# Patient Record
Sex: Male | Born: 1950 | Race: Black or African American | Hispanic: No | Marital: Single | Smoking: Former smoker
Health system: Southern US, Community
[De-identification: ages and names within clinical notes are randomized; demographics above are authoritative.]

## PROBLEM LIST (undated history)

## (undated) DIAGNOSIS — E7800 Pure hypercholesterolemia, unspecified: Secondary | ICD-10-CM

## (undated) DIAGNOSIS — I739 Peripheral vascular disease, unspecified: Secondary | ICD-10-CM

## (undated) DIAGNOSIS — F1910 Other psychoactive substance abuse, uncomplicated: Secondary | ICD-10-CM

## (undated) DIAGNOSIS — F32A Depression, unspecified: Secondary | ICD-10-CM

## (undated) DIAGNOSIS — Z720 Tobacco use: Secondary | ICD-10-CM

## (undated) DIAGNOSIS — M1990 Unspecified osteoarthritis, unspecified site: Secondary | ICD-10-CM

## (undated) HISTORY — PX: ELBOW SURGERY: SHX618

## (undated) HISTORY — DX: Peripheral vascular disease, unspecified: I73.9

## (undated) HISTORY — PX: TRACHEOSTOMY: SUR1362

## (undated) HISTORY — DX: Tobacco use: Z72.0

## (undated) HISTORY — PX: SHOULDER SURGERY: SHX246

---

## 2017-02-04 ENCOUNTER — Encounter (INDEPENDENT_AMBULATORY_CARE_PROVIDER_SITE_OTHER): Payer: Self-pay | Admitting: Orthopaedic Surgery

## 2017-02-04 ENCOUNTER — Ambulatory Visit (INDEPENDENT_AMBULATORY_CARE_PROVIDER_SITE_OTHER): Payer: Non-veteran care | Admitting: Orthopaedic Surgery

## 2017-02-04 DIAGNOSIS — S83241A Other tear of medial meniscus, current injury, right knee, initial encounter: Secondary | ICD-10-CM

## 2017-02-04 NOTE — Progress Notes (Signed)
   Office Visit Note   Patient: Adrian Washington           Date of Birth: 04-02-1950           MRN: [id number] Visit Date: 02/04/2017              Requested by: Adrian Washington, Jonathan R, PA-C [id number] [place], [place] [id number] PCP: Patient, No Pcp Per   Assessment & Plan: Visit Diagnoses:  1. 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  MRI and x-rays were reviewed and findings were discussed with the patient.  We discussed diagnostic arthroscopy and debridement as indicated however risks of surgery include incomplete relief of pain and infection.  I suspect that a component of his pain is arthritic his rather than meniscal.  Patient understands risks and would like to proceed with arthroscopic surgery.  Patient encouraged and answered. Total face to face encounter time was greater than 45 minutes and over half of this time was spent in counseling and/or coordination of care.  Follow-Up Instructions: Return if symptoms worsen or fail to improve.   Orders:  No orders of the defined types were placed in this encounter.  No orders of the defined types were placed in this encounter.     Procedures: No procedures performed   Clinical Data: No additional findings.   Subjective: Chief Complaint  Patient presents with  . Right Knee - Pain    Patient is a 66 year old gentleman who is here for [facility].  He describes medial bilateral right knee pain with minimal mechanical symptoms.  Denies any swelling.  He endorses chronic constant pain.  Cortisone injections have provided little relief.  He is also had HA injections with minimal relief.      Review of Systems  Constitutional: Negative.   All other systems reviewed and are negative.    Objective: Vital Signs: There were no vitals taken for this  visit.  Physical Exam  Constitutional: He is oriented to person, place, and time. He appears well-developed and well-nourished.  HENT:  Head: Normocephalic and atraumatic.  Eyes: Pupils are equal, round, and reactive to light.  Neck: Neck supple.  Pulmonary/Chest: Effort normal.  Abdominal: Soft.  Musculoskeletal: Normal range of motion.  Neurological: He is alert and oriented to person, place, and time.  Skin: Skin is warm.  Psychiatric: He has a normal mood and affect. His behavior is normal. Judgment and thought content normal.  Nursing note and vitals reviewed.   Ortho Exam Right knee exam shows no joint effusion.  Collaterals and cruciates are stable.  No patella tracking. Specialty Comments:  No specialty comments available.  Imaging: No results found.   PMFS History: Patient Active Problem List   Diagnosis Date Noted  . Acute medial meniscus tear of right knee 02/04/2017   History reviewed. No pertinent past medical history.  History reviewed. No pertinent family history.  History reviewed. No pertinent surgical history. Social History   Occupational History  . Not on file  Tobacco Use  . Smoking status: Never Smoker  . Smokeless tobacco: Never Used  Substance and Sexual Activity  . Alcohol use: Not on file  . Drug use: Not on file  . Sexual activity: Not on file

## 2017-02-07 ENCOUNTER — Telehealth (INDEPENDENT_AMBULATORY_CARE_PROVIDER_SITE_OTHER): Payer: Self-pay | Admitting: Orthopaedic Surgery

## 2017-02-07 NOTE — Telephone Encounter (Signed)
02[facility]04/2017 [facility] [place] [phone]/referring office

## 2017-02-13 ENCOUNTER — Other Ambulatory Visit: Payer: Self-pay

## 2017-02-13 ENCOUNTER — Encounter (HOSPITAL_COMMUNITY): Payer: Self-pay

## 2017-02-13 ENCOUNTER — Emergency Department (HOSPITAL_COMMUNITY)
Admission: EM | Admit: 2017-02-13 | Discharge: 2017-02-13 | Disposition: A | Discharge status: Home / Self Care | Payer: Non-veteran care | Attending: Emergency Medicine | Admitting: Emergency Medicine

## 2017-02-13 DIAGNOSIS — F17210 Nicotine dependence, cigarettes, uncomplicated: Secondary | ICD-10-CM | POA: Insufficient documentation

## 2017-02-13 DIAGNOSIS — R42 Dizziness and giddiness: Secondary | ICD-10-CM | POA: Diagnosis present

## 2017-02-13 DIAGNOSIS — R112 Nausea with vomiting, unspecified: Secondary | ICD-10-CM | POA: Insufficient documentation

## 2017-02-13 DIAGNOSIS — R197 Diarrhea, unspecified: Secondary | ICD-10-CM | POA: Diagnosis not present

## 2017-02-13 HISTORY — DX: Pure hypercholesterolemia, unspecified: E78.00

## 2017-02-13 LAB — COMPREHENSIVE METABOLIC PANEL
ALBUMIN: 3.7 g/dL (ref 3.5–5.0)
ALK PHOS: 109 U/L (ref 38–126)
ALT: 18 U/L (ref 17–63)
AST: 24 U/L (ref 15–41)
Anion gap: 10 (ref 5–15)
BUN: 8 mg/dL (ref 6–20)
CHLORIDE: 102 mmol/L (ref 101–111)
CO2: 29 mmol/L (ref 22–32)
CREATININE: 0.77 mg/dL (ref 0.61–1.24)
Calcium: 9.3 mg/dL (ref 8.9–10.3)
GFR calc Af Amer: >60 mL/min (ref >60)
GFR calc non Af Amer: >60 mL/min (ref >60)
GLUCOSE: 110 mg/dL — AB (ref 65–99)
Potassium: 3.7 mmol/L (ref 3.5–5.1)
SODIUM: 141 mmol/L (ref 135–145)
Total Bilirubin: 0.6 mg/dL (ref 0.3–1.2)
Total Protein: 7.6 g/dL (ref 6.5–8.1)

## 2017-02-13 LAB — URINALYSIS, ROUTINE W REFLEX MICROSCOPIC
Bilirubin Urine: NEGATIVE
GLUCOSE, UA: NEGATIVE mg/dL
Hgb urine dipstick: NEGATIVE
Ketones, ur: NEGATIVE mg/dL
LEUKOCYTES UA: NEGATIVE
NITRITE: NEGATIVE
PH: 6 (ref 5.0–8.0)
Protein, ur: NEGATIVE mg/dL
SPECIFIC GRAVITY, URINE: 1.019 (ref 1.005–1.030)

## 2017-02-13 LAB — CBG MONITORING, ED: Glucose-Capillary: 99 mg/dL (ref 65–99)

## 2017-02-13 LAB — CBC
HEMATOCRIT: 40.1 % (ref 39.0–52.0)
HEMOGLOBIN: 13.6 g/dL (ref 13.0–17.0)
MCH: 31.8 pg (ref 26.0–34.0)
MCHC: 33.9 g/dL (ref 30.0–36.0)
MCV: 93.7 fL (ref 78.0–100.0)
Platelets: 233 10*3/uL (ref 150–400)
RBC: 4.28 MIL/uL (ref 4.22–5.81)
RDW: 15.3 % (ref 11.5–15.5)
WBC: 8.2 10*3/uL (ref 4.0–10.5)

## 2017-02-13 LAB — LIPASE, BLOOD: LIPASE: 26 U/L (ref 11–51)

## 2017-02-13 MED ORDER — SODIUM CHLORIDE 0.9 % IV BOLUS (SEPSIS)
1000.0000 mL | Freq: Once | INTRAVENOUS | Status: AC
  Administered 2017-02-13: 1000 mL via INTRAVENOUS

## 2017-02-13 NOTE — ED Provider Notes (Signed)
[facility] EMERGENCY DEPARTMENT Provider Note   CSN: [id number] Arrival date & time: 02/13/17  0744     History   Chief Complaint Chief Complaint  Patient presents with  . Emesis  . Dizziness    HPI Elvyn Krohn is a 66 y.o. male with a history of hypercholesterolemia who presents to the emergency department complaining of dizziness intermittently since yesterday.  Patient describes his dizziness as feeling off balance- no room spinning or feeling lightheaded as if he may pass out.  States this only occurs when he transitions from sitting to standing and walks a few steps, the dizziness has not occurred at any other time.  Has some mild blurring of his vision with the dizziness, otherwise no associated symptoms.  Patient does note some nausea, 2 episodes of vomiting, and 2 episodes of diarrhea in the past 24 hours, emesis and diarrhea are non-bloody.  Patient states that the symptoms have not been occurring with the dizziness, they seem to be occurring completely separately, starting after dizziness episodes started. Has noted some difficulty sleeping and took a double dose of his trazodone night before onset of sxs.  Patient denies abdominal pain, fever, chills, chest pain, difficulty breathing,lightheadedness, or syncope. No numbness or weakness.   HPI  Past Medical History:  Diagnosis Date  . Hypercholesteremia     Patient Active Problem List   Diagnosis Date Noted  . Acute medial meniscus tear of right knee 02/04/2017    History reviewed. No pertinent surgical history.     Home Medications    Prior to Admission medications   Medication Sig Start Date End Date Taking? Authorizing Provider  Cholecalciferol (VITAMIN D) 2000 units tablet Take 2,000 Units by mouth daily.   Yes [provider]  simvastatin (ZOCOR) 80 MG tablet Take 80 mg by mouth at bedtime.   Yes [provider]  traZODone (DESYREL) 100 MG tablet Take 100 mg by mouth at  bedtime.   Yes [provider]    Family History History reviewed. No pertinent family history.  Social History Social History   Tobacco Use  . Smoking status: Current Every Day Smoker    Packs/day: 2.00    Types: Cigarettes  . Smokeless tobacco: Never Used  Substance Use Topics  . Alcohol use: No    Frequency: Never  . Drug use: Not on file     Allergies   Patient has no known allergies.   Review of Systems Review of Systems  Constitutional: Positive for appetite change (decreased ). Negative for chills and fever.  HENT: Negative for congestion, ear discharge, ear pain, hearing loss and tinnitus.   Eyes: Positive for visual disturbance (mild blurry vision with dizziness).  Respiratory: Negative for shortness of breath.   Cardiovascular: Negative for chest pain.  Gastrointestinal: Positive for diarrhea, nausea and vomiting. Negative for abdominal pain and blood in stool.  Genitourinary: Negative for dysuria.  Neurological: Positive for dizziness. Negative for syncope, weakness, light-headedness, numbness and headaches.    Physical Exam Updated Vital Signs BP (!) 122/93 (BP Location: Right Arm)   Pulse 60   Temp 97.7 F (36.5 C) (Oral)   Resp 17   Ht 5' 5.5" (1.664 m)   Wt 52.2 kg (115 lb)   SpO2 97%   BMI 18.85 kg/m   Physical Exam  Constitutional: He appears well-developed and well-nourished.  Non-toxic appearance. No distress.  HENT:  Head: Normocephalic and atraumatic.  Right Ear: Tympanic membrane normal.  Left Ear: Tympanic  membrane normal.  Nose: Nose normal.  Mouth/Throat: Uvula is midline.  Eyes: Conjunctivae are normal. Pupils are equal, round, and reactive to light. Right eye exhibits no discharge. Left eye exhibits no discharge.  EOMI, rightward nystagmus with EOM, extinction within 3 seconds.   Neck: Normal range of motion. Neck supple. No spinous process tenderness present.  Cardiovascular: Normal rate and regular rhythm.  No murmur  heard. Pulmonary/Chest: Breath sounds normal. No respiratory distress. He has no wheezes. He has no rales.  Abdominal: Soft. He exhibits no distension. There is no tenderness. There is no rigidity, no rebound, no guarding and no CVA tenderness.  Neurological:  Alert. Clear speech. No facial droop. CNIII-XII are intact. Bilateral upper and lower extremities' sensation intact to sharp and dull touch. 5/5 grip strength bilaterally. 5/5 plantar and dorsi flexion bilaterally. Normal finger to nose and heel to shin bilaterally. Negative pronator drift. Patient has balance difficulty when initially standing after transition from sitting. No ataxia.   Skin: Skin is warm and dry. No rash noted.  Psychiatric: He has a normal mood and affect. His behavior is normal.  Nursing note and vitals reviewed.   ED Treatments / Results  Labs Results for orders placed or performed during the hospital encounter of 02/13/17  CBC  Result Value Ref Range   WBC 8.2 4.0 - 10.5 K/uL   RBC 4.28 4.22 - 5.81 MIL/uL   Hemoglobin 13.6 13.0 - 17.0 g/dL   HCT 36.640.1 44.039.0 - 34.752.0 %   MCV 93.7 78.0 - 100.0 fL   MCH 31.8 26.0 - 34.0 pg   MCHC 33.9 30.0 - 36.0 g/dL   RDW 42.515.3 95.611.5 - 38.715.5 %   Platelets 233 150 - 400 K/uL  Urinalysis, Routine w reflex microscopic  Result Value Ref Range   Color, Urine YELLOW YELLOW   APPearance CLEAR CLEAR   Specific Gravity, Urine 1.019 1.005 - 1.030   pH 6.0 5.0 - 8.0   Glucose, UA NEGATIVE NEGATIVE mg/dL   Hgb urine dipstick NEGATIVE NEGATIVE   Bilirubin Urine NEGATIVE NEGATIVE   Ketones, ur NEGATIVE NEGATIVE mg/dL   Protein, ur NEGATIVE NEGATIVE mg/dL   Nitrite NEGATIVE NEGATIVE   Leukocytes, UA NEGATIVE NEGATIVE  Lipase, blood  Result Value Ref Range   Lipase 26 11 - 51 U/L  Comprehensive metabolic panel  Result Value Ref Range   Sodium 141 135 - 145 mmol/L   Potassium 3.7 3.5 - 5.1 mmol/L   Chloride 102 101 - 111 mmol/L   CO2 29 22 - 32 mmol/L   Glucose, Bld 110 (H) 65 - 99  mg/dL   BUN 8 6 - 20 mg/dL   Creatinine, Ser 5.640.77 0.61 - 1.24 mg/dL   Calcium 9.3 8.9 - 33.210.3 mg/dL   Total Protein 7.6 6.5 - 8.1 g/dL   Albumin 3.7 3.5 - 5.0 g/dL   AST 24 15 - 41 U/L   ALT 18 17 - 63 U/L   Alkaline Phosphatase 109 38 - 126 U/L   Total Bilirubin 0.6 0.3 - 1.2 mg/dL   GFR calc non Af Amer >60 >60 mL/min   GFR calc Af Amer >60 >60 mL/min   Anion gap 10 5 - 15  CBG monitoring, ED  Result Value Ref Range   Glucose-Capillary 99 65 - 99 mg/dL   No results found. EKG  EKG Interpretation  Date/Time:  Thursday February 13 2017 07:49:43 EST Ventricular Rate:  68 PR Interval:  204 QRS Duration: 104 QT Interval:  398  QTC Calculation: 423 R Axis:   73 Text Interpretation:  Normal sinus rhythm Left ventricular hypertrophy Abnormal ECG No old tracing to compare Confirmed by Wentz, Elliott [phone]([id number]) on 12/27/20Mancel Bale18 12:29:14 PM      Radiology No results found.  Procedures Procedures (including critical care time)  Medications Ordered in ED Medications  sodium chloride 0.9 % bolus 1,000 mL (not administered)    Initial Impression / Assessment and Plan / ED Course  I have reviewed the triage vital signs and the nursing notes.  Pertinent labs & imaging results that were available during my care of the patient were reviewed by me and considered in my medical decision making (see chart for details).  Patient presents with disequilibrium when transitioning from sitting to standing as well as N//V/D since yesterday. Disequilibrium developed prior to N/V/D. Patient is nontoxic appearing with stable vital signs. Rightward horizontal nystagmus with extinction within 3 seconds, no rotational or vertical nystagmus. His neurologic exam is without focal deficits. Patient is able to ambulate, normal finger to nose. Doubt CVA or other central cause for his symptoms.  Orthostatic vitals without significant change- however patient is symptomatic with transition from sitting to standing.  Patient also with N/V/D, non bloody, patient has no abdominal pain, completely benign abdominal exam.  No indication of appendicitis, bowel obstruction, bowel perforation, cholecystitis, pancreatitis, or diverticulitis.  Will treat with fluids and re-evaluation. Screening labs grossly unremarkable. EKG with LVH, no arrhythmia or significant ST/T wave changes. Patient feeling somewhat better and ready to go home on re-evaluation. Patient is hemodynamically stable, non toxic appearing, and in no apparent distress. Suspect viral etiology to symptoms. Instructed patient to remain well-hydrated and to practice good sleep hygiene. Recommended Kaopectate for diarrhea.  Also discussed elevated blood pressure and changes on EKG, instructed patient that he will need to follow-up with his primary care provider in regards to this as well as for his symptoms that he has been having over the past 24 hours within 1 week.  I discussed results, treatment plan, need for PCP follow-up, and return precautions with the patient. Provided opportunity for questions, patient confirmed understanding and is in agreement with plan.   Findings and plan of care discussed with supervising physician Dr. Effie ShyWentz who personally evaluated and examined this patient.   Final Clinical Impressions(s) / ED Diagnoses   Final diagnoses:  Nausea vomiting and diarrhea  Dizziness    ED Discharge Orders    None       Cherly Andersonetrucelli, Alquan Morrish R, PA-C 02/13/17 1659    Mancel BaleWentz, Elliott, MD 02/13/17 1700

## 2017-02-13 NOTE — ED Triage Notes (Signed)
Pt states dizziness with nausea and emesis that started yesterday. He states he was at work when the episode started. Pt appears pale. Skin warm and dry.

## 2017-02-13 NOTE — Discharge Instructions (Addendum)
You were seen in the emergency department for dizziness, nausea, vomiting, and diarrhea. We suspect these symptoms to be virus related. Your lab work did not show signs of infection, anemia, electrolyte abnormality, or a problem with your liver, pancreas, or kidneys.   Your blood pressure was elevated in the emergency department today and your EKG showed some findings that are possibly consistent with this. It is important that you follow up to have this rechecked and possibly start blood pressure medication.  As discussed it is important that you rest, remain well hydrated, and practice good sleep hygiene (see attached handout)   Take Kaopectate (Bismuth Subsalicylate) for your diarrhea. You can get this over the counter, ask your pharmacist if you are unable to find it at the drug store.   Follow up with your primary care provider, if you do not have one one is provided in your discharge instructions within 1 week for re-evaluation of your symptoms and of your blood pressure. Return to the emergency department for any new or worsening symptoms including, but not limited to falls, passing out, inability to keep down fluids, abdomina pain, chest pain, or difficulty breathing.

## 2017-02-13 NOTE — ED Provider Notes (Addendum)
  Face-to-face evaluation   History: He presents for evaluation of dizziness, 2 days.  Dizziness is described as a difficulty walking.  Sometimes when walking he bumps into things.  He denies headache, neck pain or back pain.  He has been having trouble sleeping for 2 days.  He has chronic insomnia for which he uses trazodone.  He denies use of alcohol or illegal drugs.   Physical exam: Alert, calm, cooperative.  Mild nystagmus left gaze, left eye, extinguishes less than 3 seconds.  Normal grip strength.  Normal finger to nose and heel to shin, bilaterally.  Medical screening examination/treatment/procedure(s) were conducted as a shared visit with non-physician practitioner(s) and myself.  I personally evaluated the patient during the encounter       Mancel BaleWentz, Shriyans Kuenzi, MD 02/13/17 1700

## 2017-02-16 ENCOUNTER — Emergency Department (HOSPITAL_COMMUNITY)
Admission: EM | Admit: 2017-02-16 | Discharge: 2017-02-16 | Discharge status: Against Medical Advice | Payer: Non-veteran care | Attending: Emergency Medicine | Admitting: Emergency Medicine

## 2017-02-16 ENCOUNTER — Encounter (HOSPITAL_COMMUNITY): Payer: Self-pay

## 2017-02-16 ENCOUNTER — Other Ambulatory Visit: Payer: Self-pay

## 2017-02-16 DIAGNOSIS — Z79899 Other long term (current) drug therapy: Secondary | ICD-10-CM | POA: Diagnosis not present

## 2017-02-16 DIAGNOSIS — F17210 Nicotine dependence, cigarettes, uncomplicated: Secondary | ICD-10-CM | POA: Insufficient documentation

## 2017-02-16 DIAGNOSIS — H532 Diplopia: Secondary | ICD-10-CM | POA: Diagnosis not present

## 2017-02-16 DIAGNOSIS — R42 Dizziness and giddiness: Secondary | ICD-10-CM | POA: Diagnosis not present

## 2017-02-16 NOTE — ED Notes (Signed)
Pt denies any dizziness or visual change on room arrival.

## 2017-02-16 NOTE — ED Notes (Signed)
Labs collected.  Holding in minilab

## 2017-02-16 NOTE — ED Notes (Signed)
Pt oriented that we only waiting for MRI to be completed, per MRI it will be 45 min for pt to go to MRI, pt states he is not willing to wait and [facility] Sr. Denton LankSteinl notified.

## 2017-02-16 NOTE — ED Triage Notes (Signed)
PT reports "seeing double" since 1230 pm today. Pt reports he is "seeing one on top of the other" when it is only one object is in front of him. Denies dizziness, unilateral weakness, confusion, speech changes.

## 2017-02-16 NOTE — ED Provider Notes (Signed)
[facility] Provider Note   CSN: [id number] Arrival date & time: 02/16/17  1736     History   Chief Complaint Chief Complaint  Patient presents with  . Visual Field Change    HPI Adrian Washington is a 66 y.o. male.  Patient c/o intermittent seeing double for past day. States sees two images, one on top of another, when both eyes open. When closes one eye, there is no double vision. Denies hx same. States 2 days ago was seen in ED for dizziness which he describes as a room spinning and imbalance type of feeling. He indicates those symptoms have improved. No change in hearing or tinnitus. No hx vertigo. No hx cva. Denies headache. No visual field deficit or amaurosis. No change in speech. Denies any numbness/weakness. No eye pain.    The history is provided by the patient.    Past Medical History:  Diagnosis Date  . Hypercholesteremia     Patient Active Problem List   Diagnosis Date Noted  . Acute medial meniscus tear of right knee 02/04/2017    History reviewed. No pertinent surgical history.     Home Medications    Prior to Admission medications   Medication Sig Start Date End Date Taking? Authorizing Provider  Cholecalciferol (VITAMIN D) 2000 units tablet Take 2,000 Units by mouth daily.    [provider]  simvastatin (ZOCOR) 80 MG tablet Take 80 mg by mouth at bedtime.    [provider]  traZODone (DESYREL) 100 MG tablet Take 100 mg by mouth at bedtime.    [provider]    Family History No family history on file.  Social History Social History   Tobacco Use  . Smoking status: Current Every Day Smoker    Packs/day: 2.00    Types: Cigarettes  . Smokeless tobacco: Never Used  Substance Use Topics  . Alcohol use: No    Frequency: Never  . Drug use: Not on file     Allergies   Patient has no known allergies.   Review of Systems Review of Systems  Constitutional: Negative for  fever.  HENT: Negative for sore throat.   Eyes: Positive for visual disturbance. Negative for pain and redness.  Respiratory: Negative for cough and shortness of breath.   Cardiovascular: Negative for chest pain.  Gastrointestinal: Negative for abdominal pain and vomiting.  Genitourinary: Negative for flank pain.  Musculoskeletal: Negative for back pain and neck pain.  Skin: Negative for rash.  Neurological: Negative for speech difficulty, weakness, numbness and headaches.  Hematological: Does not bruise/bleed easily.  Psychiatric/Behavioral: Negative for confusion.     Physical Exam Updated Vital Signs BP (!) 142/84 (BP Location: Right Arm)   Pulse (!) 55   Temp 98.4 F (36.9 C) (Oral)   Resp 16   Ht 1.651 m (5\' 5" )   Wt 52.2 kg (115 lb)   SpO2 98%   BMI 19.14 kg/m   Physical Exam  Constitutional: He is oriented to person, place, and time. He appears well-developed and well-nourished. No distress.  HENT:  Head: Atraumatic.  Right Ear: External ear normal.  Left Ear: External ear normal.  Mouth/Throat: Oropharynx is clear and moist.  Eyes: Conjunctivae and EOM are normal. Pupils are equal, round, and reactive to light.  Neck: Neck supple. No tracheal deviation present. No thyromegaly present.  No bruits.   Cardiovascular: Regular rhythm, normal heart sounds and intact distal pulses. Exam reveals no gallop and no friction rub.  No murmur heard. Pulmonary/Chest: Effort normal and breath sounds normal. No accessory muscle usage. No respiratory distress.  Abdominal: Soft. Bowel sounds are normal. He exhibits no distension.  Musculoskeletal: He exhibits no edema.  Neurological: He is alert and oriented to person, place, and time. No cranial nerve deficit.  Speech clear/fluent. Motor intact bil, stre 5/5. No pronator drift. Normal finger to nose bil. Steady gait. sens grossly intact.   Skin: Skin is warm and dry. He is not diaphoretic.  Psychiatric: He has a normal mood and  affect.  Nursing note and vitals reviewed.    ED Treatments / Results  Labs (all labs ordered are listed, but only abnormal results are displayed) Labs Reviewed - No data to display  EKG  EKG Interpretation  Date/Time:  Sunday February 16 2017 18:33:08 EST Ventricular Rate:  59 PR Interval:  198 QRS Duration: 94 QT Interval:  410 QTC Calculation: 405 R Axis:   63 Text Interpretation:  Sinus bradycardia Nonspecific ST abnormality No significant change since last tracing Confirmed by Cathren LaineSteinl, Odilon Cass ([id number]) on 02/16/2017 9:33:32 PM       Radiology No results found.  Procedures Procedures (including critical care time)  Medications Ordered in ED Medications - No data to display   Initial Impression / Assessment and Plan / ED Course  I have reviewed the triage vital signs and the nursing notes.  Pertinent labs & imaging results that were available during my care of the patient were reviewed by me and considered in my medical decision making (see chart for details).  Given dizziness and visual changes, will get MR imaging.  Reviewed nursing notes and prior charts for additional history.   Mri pending - radiology called re time estimate.   Went to recheck pt - pt had left ED AMA prior to completion of his evaluation and treatment.     Final Clinical Impressions(s) / ED Diagnoses   Final diagnoses:  None    ED Discharge Orders    None       Cathren LaineSteinl, Kiptyn Rafuse, MD 02/16/17 2242

## 2017-02-23 ENCOUNTER — Encounter (HOSPITAL_COMMUNITY): Payer: Self-pay | Admitting: Emergency Medicine

## 2017-02-23 ENCOUNTER — Emergency Department (HOSPITAL_COMMUNITY): Payer: Non-veteran care

## 2017-02-23 DIAGNOSIS — S0990XA Unspecified injury of head, initial encounter: Secondary | ICD-10-CM | POA: Insufficient documentation

## 2017-02-23 DIAGNOSIS — Y9389 Activity, other specified: Secondary | ICD-10-CM | POA: Diagnosis not present

## 2017-02-23 DIAGNOSIS — F17210 Nicotine dependence, cigarettes, uncomplicated: Secondary | ICD-10-CM | POA: Diagnosis not present

## 2017-02-23 DIAGNOSIS — Y990 Civilian activity done for income or pay: Secondary | ICD-10-CM | POA: Diagnosis not present

## 2017-02-23 DIAGNOSIS — S01112A Laceration without foreign body of left eyelid and periocular area, initial encounter: Secondary | ICD-10-CM | POA: Diagnosis not present

## 2017-02-23 DIAGNOSIS — Y929 Unspecified place or not applicable: Secondary | ICD-10-CM | POA: Insufficient documentation

## 2017-02-23 DIAGNOSIS — W11XXXA Fall on and from ladder, initial encounter: Secondary | ICD-10-CM | POA: Diagnosis not present

## 2017-02-23 DIAGNOSIS — Z79899 Other long term (current) drug therapy: Secondary | ICD-10-CM | POA: Insufficient documentation

## 2017-02-23 NOTE — ED Triage Notes (Signed)
Pt from work, slipped and fell off ladder. Pt has 1-2 in lac above L eye bleeding is controlled. Pt also reports neck pain. Per Dr. Ranae PalmsYelverton order CT Head and CT C-Spine. Pt is A/OX4, denies dizziness/lightheadedness, N/V.

## 2017-02-24 ENCOUNTER — Emergency Department (HOSPITAL_COMMUNITY)
Admission: EM | Admit: 2017-02-24 | Discharge: 2017-02-24 | Disposition: A | Discharge status: Home / Self Care | Payer: Non-veteran care | Attending: Emergency Medicine | Admitting: Emergency Medicine

## 2017-02-24 DIAGNOSIS — S0181XA Laceration without foreign body of other part of head, initial encounter: Secondary | ICD-10-CM

## 2017-02-24 DIAGNOSIS — S0990XA Unspecified injury of head, initial encounter: Secondary | ICD-10-CM

## 2017-02-24 MED ORDER — ACETAMINOPHEN 325 MG PO TABS
650.0000 mg | ORAL_TABLET | Freq: Once | ORAL | Status: AC
  Administered 2017-02-24: 650 mg via ORAL
  Filled 2017-02-24: qty 2

## 2017-02-24 MED ORDER — BACITRACIN ZINC 500 UNIT/GM EX OINT
TOPICAL_OINTMENT | Freq: Two times a day (BID) | CUTANEOUS | Status: DC
  Administered 2017-02-24: 03:00:00 via TOPICAL

## 2017-02-24 MED ORDER — LIDOCAINE-EPINEPHRINE (PF) 2 %-1:200000 IJ SOLN
10.0000 mL | Freq: Once | INTRAMUSCULAR | Status: AC
  Administered 2017-02-24: 10 mL
  Filled 2017-02-24: qty 20

## 2017-02-24 NOTE — ED Provider Notes (Signed)
[facility] EMERGENCY DEPARTMENT Provider Note   CSN: [id number] Arrival date & time: 02/23/17  1848     History   Chief Complaint Chief Complaint  Patient presents with  . Fall    HPI Adrian Washington is a 67 y.o. male with a past medical history of HLD, who presents to ED for evaluation of headache and neck pain after falling off of a ladder prior to arrival. He denies any loss of consciousness. He did not take any medications prior to arrival. Denies any previous neck surgeries, vision changes, vomiting, changes in gait or blood thinner use. He has been ambulatory since the fall. Denies any chest pain, trouble breathing, numbness in legs. States his last tetanus was last year.   HPI  Past Medical History:  Diagnosis Date  . Hypercholesteremia     Patient Active Problem List   Diagnosis Date Noted  . Acute medial meniscus tear of right knee 02/04/2017    Past Surgical History:  Procedure Laterality Date  . ELBOW SURGERY Right   . SHOULDER SURGERY Left   . TRACHEOSTOMY         Home Medications    Prior to Admission medications   Medication Sig Start Date End Date Taking? Authorizing Provider  Cholecalciferol (VITAMIN D) 2000 units tablet Take 2,000 Units by mouth daily.    [provider]  simvastatin (ZOCOR) 80 MG tablet Take 80 mg by mouth at bedtime.    [provider]  traZODone (DESYREL) 100 MG tablet Take 100 mg by mouth at bedtime.    [provider]    Family History No family history on file.  Social History Social History   Tobacco Use  . Smoking status: Current Every Day Smoker    Packs/day: 2.00    Types: Cigarettes  . Smokeless tobacco: Never Used  Substance Use Topics  . Alcohol use: No    Frequency: Never  . Drug use: Not on file     Allergies   Patient has no known allergies.   Review of Systems Review of Systems  Constitutional: Negative for appetite change, chills and fever.  HENT:  Negative for ear pain, rhinorrhea, sneezing and sore throat.   Eyes: Negative for photophobia and visual disturbance.  Respiratory: Negative for cough, chest tightness, shortness of breath and wheezing.   Cardiovascular: Negative for chest pain and palpitations.  Gastrointestinal: Negative for abdominal pain, blood in stool, constipation, diarrhea, nausea and vomiting.  Genitourinary: Negative for dysuria, hematuria and urgency.  Musculoskeletal: Positive for neck pain. Negative for myalgias.  Skin: Positive for wound. Negative for rash.  Neurological: Positive for headaches. Negative for dizziness, weakness and light-headedness.     Physical Exam Updated Vital Signs BP (!) 159/93   Pulse 75   Temp 98.4 F (36.9 C) (Oral)   Resp 18   Ht 5\' 5"  (1.651 m)   Wt 52.2 kg (115 lb)   SpO2 94%   BMI 19.14 kg/m   Physical Exam  Constitutional: He is oriented to person, place, and time. He appears well-developed and well-nourished. No distress.  Nontoxic appearing and in no acute distress.  HENT:  Head: Normocephalic and atraumatic.  Nose: Nose normal.  Eyes: Conjunctivae and EOM are normal. Left eye exhibits no discharge. No scleral icterus.  Neck: Normal range of motion. Neck supple.  Cardiovascular: Normal rate, regular rhythm, normal heart sounds and intact distal pulses. Exam reveals no gallop and no friction rub.  No murmur heard. Pulmonary/Chest: Effort  normal and breath sounds normal. No respiratory distress.  Abdominal: Soft. Bowel sounds are normal. He exhibits no distension. There is no tenderness. There is no guarding.  Musculoskeletal: Normal range of motion. He exhibits no edema.  TTP of C-spine at midline and paraspinal musculature. No midline spinal tenderness present in lumbar, thoracic spine. No step-off palpated. No visible bruising, edema or temperature change noted. No objective signs of numbness present. No saddle anesthesia.  Neurological: He is alert and oriented  to person, place, and time. No cranial nerve deficit or sensory deficit. He exhibits normal muscle tone. Coordination normal.  Pupils reactive. No facial asymmetry noted. Cranial nerves appear grossly intact. Sensation intact to light touch on face, BUE and BLE. Strength 5/5 in BUE and BLE. Normal finger to nose coordination bilaterally.  Skin: Skin is warm and dry. No rash noted.  2cm linear laceration to R eyebrow.  Psychiatric: He has a normal mood and affect.  Nursing note and vitals reviewed.    ED Treatments / Results  Labs (all labs ordered are listed, but only abnormal results are displayed) Labs Reviewed - No data to display  EKG  EKG Interpretation None       Radiology Ct Head Wo Contrast  Result Date: 02/23/2017 CLINICAL DATA:  Maxface trauma blunt; C-spine trauma, high clinical risk (NEXUS/CCR). Slipped off a ladder at work, neck pain and laceration above left eye. EXAM: CT HEAD WITHOUT CONTRAST CT CERVICAL SPINE WITHOUT CONTRAST TECHNIQUE: Multidetector CT imaging of the head and cervical spine was performed following the standard protocol without intravenous contrast. Multiplanar CT image reconstructions of the cervical spine were also generated. COMPARISON:  None. FINDINGS: CT HEAD FINDINGS Brain: No intracranial hemorrhage, mass effect, or midline shift. 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 No hydrocephalus. The basilar cisterns are patent. No evidence of territorial infarct or acute ischemia. No extra-axial or intracranial fluid collection. Vascular: Atherosclerosis of skullbase vasculature without hyperdense vessel or abnormal calcification. Skull: No fracture or focal lesion. Sinuses/Orbits: Left supraorbital laceration and small scalp contusion. No evidence of associated orbital fracture. Minimal mucosal thickening of the ethmoid air cells without sinus fluid level. Mastoid air cells are clear. Other: None. CT CERVICAL SPINE FINDINGS  Alignment: Straightening of normal lordosis. Trace anterolisthesis of C4 on C5 is likely degenerative. No traumatic subluxation. Skull base and vertebrae: No acute fracture. Vertebral body heights are maintained. The dens and skull base are intact. Modic endplate changes with sclerosis at C5-C6 and C7-T1. Soft tissues and spinal canal: No prevertebral fluid or swelling. No visible canal hematoma. Disc levels: Advanced disc space narrowing and endplate spurring at C5-C6 and C7-T1. Lesser disc space narrowing and endplate spurring at all other levels. There is scattered facet arthropathy. Scattered bilateral neural foraminal stenosis on a degenerative basis. No definite central canal stenosis. Upper chest: Clear. Other: Carotid calcifications. IMPRESSION: 1. Left frontal scalp laceration and small contusion. No acute intracranial abnormality. No skull fracture. 2. 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 3. 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 4. Carotid and skullbase atherosclerosis. Electronically Signed   By: Rubye Oaks M.D.   On: 02/23/2017 21:34   Ct Cervical Spine Wo Contrast  Result Date: 02/23/2017 CLINICAL DATA:  Maxface trauma blunt; C-spine trauma, high clinical risk (NEXUS/CCR). Slipped off a ladder at work, neck pain and laceration above left eye. EXAM: CT HEAD WITHOUT CONTRAST CT CERVICAL SPINE WITHOUT CONTRAST  TECHNIQUE: Multidetector CT imaging of the head and cervical spine was performed following the standard protocol without intravenous contrast. Multiplanar CT image reconstructions of the cervical spine were also generated. COMPARISON:  None. FINDINGS: CT HEAD FINDINGS Brain: No intracranial hemorrhage, mass effect, or midline shift. 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 No hydrocephalus. The basilar cisterns are patent. No evidence of territorial infarct  or acute ischemia. No extra-axial or intracranial fluid collection. Vascular: Atherosclerosis of skullbase vasculature without hyperdense vessel or abnormal calcification. Skull: No fracture or focal lesion. Sinuses/Orbits: Left supraorbital laceration and small scalp contusion. No evidence of associated orbital fracture. Minimal mucosal thickening of the ethmoid air cells without sinus fluid level. Mastoid air cells are clear. Other: None. CT CERVICAL SPINE FINDINGS Alignment: Straightening of normal lordosis. Trace anterolisthesis of C4 on C5 is likely degenerative. No traumatic subluxation. Skull base and vertebrae: No acute fracture. Vertebral body heights are maintained. The dens and skull base are intact. Modic endplate changes with sclerosis at C5-C6 and C7-T1. Soft tissues and spinal canal: No prevertebral fluid or swelling. No visible canal hematoma. Disc levels: Advanced disc space narrowing and endplate spurring at C5-C6 and C7-T1. Lesser disc space narrowing and endplate spurring at all other levels. There is scattered facet arthropathy. Scattered bilateral neural foraminal stenosis on a degenerative basis. No definite central canal stenosis. Upper chest: Clear. Other: Carotid calcifications. IMPRESSION: 1. Left frontal scalp laceration and small contusion. No acute intracranial abnormality. No skull fracture. 2. 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 3. 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 4. Carotid and skullbase atherosclerosis. Electronically Signed   By: Rubye OaksMelanie  Ehinger M.D.   On: 02/23/2017 21:34    Procedures .Marland Kitchen.Laceration Repair Date/Time: 02/24/2017 2:50 AM Performed by: Dietrich PatesKhatri, Rickeya Manus, PA-C Authorized by: Dietrich PatesKhatri, Teia Freitas, PA-C   Consent:    Consent obtained:  Verbal   Consent given by:  Patient   Risks discussed:  Infection, need for additional repair, pain, poor cosmetic result, poor  wound healing, nerve damage, retained foreign body, tendon damage and vascular damage Laceration details:    Location:  Face   Face location:  L eyebrow   Length (cm):  2 Exploration:    Hemostasis achieved with:  Epinephrine and direct pressure Treatment:    Area cleansed with:  Saline   Amount of cleaning:  Extensive   Irrigation solution:  Sterile saline   Irrigation method:  Syringe Skin repair:    Repair method:  Sutures   Suture size:  5-0   Wound skin closure material used: Ethilon.   Suture technique:  Simple interrupted   Number of sutures:  8 Approximation:    Approximation:  Close Post-procedure details:    Dressing:  Antibiotic ointment   Patient tolerance of procedure:  Tolerated well, no immediate complications   (including critical care time)  Medications Ordered in ED Medications  bacitracin ointment ( Topical Given 02/24/17 0249)  lidocaine-EPINEPHrine (XYLOCAINE W/EPI) 2 %-1:200000 (PF) injection 10 mL (10 mLs Infiltration Given 02/24/17 0159)  acetaminophen (TYLENOL) tablet 650 mg (650 mg Oral Given 02/24/17 0203)     Initial Impression / Assessment and Plan / ED Course  I have reviewed the triage vital signs and the nursing notes.  Pertinent labs & imaging results that were available during my care of the patient were reviewed by me and considered in my medical decision making (see chart for details).  Patient presents to ED for evaluation of headache, neck pain and laceration after falling off a ladder prior to arrival.  States that the latter was unstable and he fell off because of it.  He denies any loss of consciousness or blood thinner use.  He has been ambulatory with normal gait since the incident.  On physical exam he is overall well-appearing.  He has no deficits on his neurological exam.  There is a 2 cm laceration to the left eyebrow.  Patient states that he is up-to-date on his tetanus.  CT of head and neck return as negative for acute  abnormality.  Laceration was repaired and he was told to return appropriate time for suture removal.  Pain controlled here in the ED with Tylenol.  Patient appears stable for discharge at this time.  Strict return precautions given.  Patient discussed with and seen by Dr. Nicanor Alcon.  Final Clinical Impressions(s) / ED Diagnoses   Final diagnoses:  Injury of head, initial encounter  Facial laceration, initial encounter    ED Discharge Orders    None     Portions of this note were generated with Dragon dictation software. Dictation errors may occur despite best attempts at proofreading.    Dietrich Pates, PA-C 02/24/17 1610    Palumbo, April, MD 02/24/17 [phone]

## 2017-02-24 NOTE — ED Notes (Signed)
Pt understood dc material. NAD noted. Suture removal discussed and repeated back

## 2017-02-24 NOTE — Discharge Instructions (Signed)
Please read attached information regarding your condition. Apply antibiotic ointment to wound as directed. Follow-up with your primary care provider for further evaluation. Return in 5 days for suture removal. You may return to this Ed, any urgent care or your primary care's office. Return sooner for signs of infection including redness surrounding wound, drainage or increased bleeding, fevers.

## 2017-02-28 ENCOUNTER — Other Ambulatory Visit: Payer: Self-pay

## 2017-02-28 ENCOUNTER — Emergency Department (HOSPITAL_COMMUNITY)
Admission: EM | Admit: 2017-02-28 | Discharge: 2017-02-28 | Disposition: A | Discharge status: Home / Self Care | Payer: Non-veteran care | Attending: Emergency Medicine | Admitting: Emergency Medicine

## 2017-02-28 DIAGNOSIS — S01112D Laceration without foreign body of left eyelid and periocular area, subsequent encounter: Secondary | ICD-10-CM | POA: Diagnosis not present

## 2017-02-28 DIAGNOSIS — R51 Headache: Secondary | ICD-10-CM | POA: Diagnosis not present

## 2017-02-28 DIAGNOSIS — Z79899 Other long term (current) drug therapy: Secondary | ICD-10-CM | POA: Insufficient documentation

## 2017-02-28 DIAGNOSIS — W11XXXD Fall on and from ladder, subsequent encounter: Secondary | ICD-10-CM | POA: Insufficient documentation

## 2017-02-28 DIAGNOSIS — F17210 Nicotine dependence, cigarettes, uncomplicated: Secondary | ICD-10-CM | POA: Diagnosis not present

## 2017-02-28 DIAGNOSIS — Z4802 Encounter for removal of sutures: Secondary | ICD-10-CM

## 2017-02-28 DIAGNOSIS — M542 Cervicalgia: Secondary | ICD-10-CM | POA: Diagnosis not present

## 2017-02-28 MED ORDER — CYCLOBENZAPRINE HCL 10 MG PO TABS: 10.0000 mg | ORAL_TABLET | Freq: Two times a day (BID) | ORAL | 0 refills | Status: DC | PRN

## 2017-02-28 MED ORDER — TRAMADOL HCL 50 MG PO TABS: 50.0000 mg | ORAL_TABLET | Freq: Four times a day (QID) | ORAL | 0 refills | Status: DC | PRN

## 2017-02-28 NOTE — ED Triage Notes (Signed)
Pt reports he fell off ladder on Sunday and had stitches placed about left eye. Pt here to have stitches removed. Pt reports still having headache AND neck pain.

## 2017-02-28 NOTE — ED Provider Notes (Signed)
[facility] EMERGENCY DEPARTMENT Provider Note   CSN: [id number] Arrival date & time: 02/28/17  0707     History   Chief Complaint Chief Complaint  Patient presents with  . Suture / Staple Removal    HPI Adrian Washington is a 67 y.o. male.  HPI  Adrian Washington is a 67 year old male with a history of hyperlipidemia who presents to the emergency department for suture removal.  Larey Seat off of a ladder 5 days ago, hitting his head and sustaining a laceration on the left eyebrow.  CT head and cervical spine scan following the fall without acute abnormality.  Eight simple interrupted sutures were placed over the left eyebrow.  Patient also states that he has had continued neck pain/stiffness and headache since the fall.  Reports the pain in his neck is bothering him the most.  Pain is in the posterior neck and bilateral, described as sharp and shooting and worsened with any neck movement.  Pain radiates into his bilateral temples.  Reports he has taken ibuprofen and Tylenol without significant relief of the pain.  He denies vision changes, numbness, weakness, dysarthria, dysphagia, chest pain, shortness of breath, abdominal pain, fever, chills.  He is able to ambulate independently.  Past Medical History:  Diagnosis Date  . Hypercholesteremia     Patient Active Problem List   Diagnosis Date Noted  . Acute medial meniscus tear of right knee 02/04/2017    Past Surgical History:  Procedure Laterality Date  . ELBOW SURGERY Right   . SHOULDER SURGERY Left   . TRACHEOSTOMY         Home Medications    Prior to Admission medications   Medication Sig Start Date End Date Taking? Authorizing Provider  Cholecalciferol (VITAMIN D) 2000 units tablet Take 2,000 Units by mouth daily.    [provider]  simvastatin (ZOCOR) 80 MG tablet Take 80 mg by mouth at bedtime.    [provider]  traZODone (DESYREL) 100 MG tablet Take 100 mg by mouth at bedtime.    [provider]    Family History No family history on file.  Social History Social History   Tobacco Use  . Smoking status: Current Every Day Smoker    Packs/day: 2.00    Types: Cigarettes  . Smokeless tobacco: Never Used  Substance Use Topics  . Alcohol use: No    Frequency: Never  . Drug use: Not on file     Allergies   Patient has no known allergies.   Review of Systems Review of Systems  Constitutional: Negative for chills and fever.  HENT: Negative for facial swelling and trouble swallowing.   Eyes: Negative for visual disturbance.  Respiratory: Negative for shortness of breath.   Cardiovascular: Negative for chest pain.  Gastrointestinal: Negative for abdominal pain, nausea and vomiting.  Musculoskeletal: Positive for neck pain and neck stiffness. Negative for gait problem.  Skin: Positive for wound (laceration over left eyebrow, stitches in place).  Neurological: Positive for headaches. Negative for dizziness, speech difficulty, weakness, light-headedness and numbness.  Psychiatric/Behavioral: Negative for agitation.     Physical Exam Updated Vital Signs BP (!) 126/95 (BP Location: Right Arm)   Pulse (!) 57   Temp 98 F (36.7 C) (Oral)   Resp 16   SpO2 100%   Physical Exam  Constitutional: He is oriented to person, place, and time. He appears well-developed and well-nourished. No distress.  HENT:  Head: Normocephalic and atraumatic.  Mouth/Throat: Oropharynx is clear  and moist. No oropharyngeal exudate.  2cm laceration noted over left eyebrow with sutures in place. Skin has not completely closed. Mild serosanguinous fluid draining. No surrounding erythema or warmth. No tenderness overlying.   Eyes: Conjunctivae and EOM are normal. Pupils are equal, round, and reactive to light. Right eye exhibits no discharge. Left eye exhibits no discharge.  Neck: Neck supple.  Tender to palpation over the bilateral paraspinal muscles of the cervical spine. Full active  ROM, although painful.   Pulmonary/Chest: Effort normal. No respiratory distress.  Lymphadenopathy:    He has no cervical adenopathy.  Neurological: He is alert and oriented to person, place, and time. Coordination normal.  Mental Status:  Alert, oriented, thought content appropriate, able to give a coherent history. Speech fluent without evidence of aphasia. Able to follow 2 step commands without difficulty.  Cranial Nerves:  II:  Peripheral visual fields grossly normal, pupils equal, round, reactive to light III,IV, VI: ptosis not present, extra-ocular motions intact bilaterally  V,VII: smile symmetric, facial light touch sensation equal VIII: hearing grossly normal to voice  X: uvula elevates symmetrically  XI: bilateral shoulder shrug symmetric and strong XII: midline tongue extension without fassiculations Motor:  Normal tone. 5/5 in upper and lower extremities bilaterally including strong and equal grip strength and dorsiflexion/plantar flexion Sensory: Pinprick and light touch normal in all extremities.  Deep Tendon Reflexes: 2+ and symmetric in the biceps and patella Cerebellar: normal finger-to-nose with bilateral upper extremities Gait: normal gait and balance  Skin: Skin is warm and dry. Capillary refill takes less than 2 seconds. He is not diaphoretic.  Psychiatric: He has a normal mood and affect. His behavior is normal.  Nursing note and vitals reviewed.    ED Treatments / Results  Labs (all labs ordered are listed, but only abnormal results are displayed) Labs Reviewed - No data to display  EKG  EKG Interpretation None       Radiology No results found.  Procedures Procedures (including critical care time)  Medications Ordered in ED Medications - No data to display   Initial Impression / Assessment and Plan / ED Course  I have reviewed the triage vital signs and the nursing notes.  Pertinent labs & imaging results that were available during my care of  the patient were reviewed by me and considered in my medical decision making (see chart for details).    Patient presents for suture removal.  Wound does not appear to be completely closed.  It has been 5 days since stitches were placed.  Have counseled him to return in 2-3 days for removal.  Patient also continues to have neck pain since the fall.  He has no neurological deficits on exam.  CT scan of head and neck without acute abnormality after his fall 5 days ago.  His tenderness is located primarily in the paraspinal muscles, suspect this is related to muscle strain.  Will discharge him with a muscle relaxer.  Discussed with patient extensively that this medication can cause drowsiness and he should not drive, work or drink alcohol while taking it.  Discussed return precautions and patient agrees and voiced understanding. Discussed this patient with Dr. Denton LankSteinl who also saw the patient and agrees with above plan.   Final Clinical Impressions(s) / ED Diagnoses   Final diagnoses:  Visit for suture removal    ED Discharge Orders        Ordered    cyclobenzaprine (FLEXERIL) 10 MG tablet  2 times daily PRN  02/28/17 0942       Kellie Shropshire, PA-C 03/02/17 1609    Cathren Laine, MD 03/02/17 [phone]

## 2017-02-28 NOTE — Discharge Instructions (Signed)
Please return to the emergency department on Monday for removal of stitches.  I have written you a work note.  You can take a muscle relaxer medicine for your neck pain.  This medicine can make you drowsy so please do not drive, work or drink alcohol while taking it.  Please apply heat to the neck to help with your symptoms and also move the neck as tolerated to prevent worsening stiffness.  Return to the emergency department if you have worsening headache with fever greater than 100.4 F, headache in which you have trouble seeing, headache with numbness in which he can no longer feel your hands or fingers or feet or legs.  Please also return for any new or worsening symptoms.

## 2017-03-04 ENCOUNTER — Encounter (HOSPITAL_COMMUNITY): Payer: Self-pay | Admitting: Emergency Medicine

## 2017-03-04 ENCOUNTER — Emergency Department (HOSPITAL_COMMUNITY)
Admission: EM | Admit: 2017-03-04 | Discharge: 2017-03-04 | Disposition: A | Discharge status: Home / Self Care | Payer: Non-veteran care | Attending: Emergency Medicine | Admitting: Emergency Medicine

## 2017-03-04 DIAGNOSIS — M62838 Other muscle spasm: Secondary | ICD-10-CM | POA: Diagnosis not present

## 2017-03-04 DIAGNOSIS — F17210 Nicotine dependence, cigarettes, uncomplicated: Secondary | ICD-10-CM | POA: Diagnosis not present

## 2017-03-04 DIAGNOSIS — Z79899 Other long term (current) drug therapy: Secondary | ICD-10-CM | POA: Diagnosis not present

## 2017-03-04 DIAGNOSIS — Z4802 Encounter for removal of sutures: Secondary | ICD-10-CM | POA: Insufficient documentation

## 2017-03-04 MED ORDER — BACITRACIN ZINC 500 UNIT/GM EX OINT
TOPICAL_OINTMENT | Freq: Two times a day (BID) | CUTANEOUS | Status: DC
  Administered 2017-03-04: 1 via TOPICAL

## 2017-03-04 MED ORDER — METHOCARBAMOL 500 MG PO TABS: 500.0000 mg | ORAL_TABLET | Freq: Two times a day (BID) | ORAL | 0 refills | Status: DC

## 2017-03-04 NOTE — Medical Student Note (Signed)
[facility]-EMERGENCY DEPT Provider Student Note For educational purposes for Medical, PA and NP students only and not part of the legal medical record.   CSN:N/[id number] Arrival date & time: 03/04/17  [id number]     History   Chief Complaint Chief Complaint  Patient presents with  . Suture / Staple Removal    HPI  67 y.o. male.  HPI  Past Medical History:  Diagnosis Date  . Hypercholesteremia     Patient Active Problem List   Diagnosis Date Noted  . Acute medial meniscus tear of right knee 02/04/2017    Past Surgical History:  Procedure Laterality Date  . ELBOW SURGERY Right   . SHOULDER SURGERY Left   . TRACHEOSTOMY         Home Medications    Prior to Admission medications   Medication Sig Start Date End Date Taking? Authorizing Provider  Cholecalciferol (VITAMIN D) 2000 units tablet Take 2,000 Units by mouth daily.    [provider]  methocarbamol (ROBAXIN) 500 MG tablet Take 1 tablet (500 mg total) by mouth 2 (two) times daily. 03/04/17   Law, Waylan BogaAlexandra M, PA-C  simvastatin (ZOCOR) 80 MG tablet Take 80 mg by mouth at bedtime.    [provider]  traMADol (ULTRAM) 50 MG tablet Take 1 tablet (50 mg total) by mouth every 6 (six) hours as needed. 02/28/17   Kellie ShropshireShrosbree, Emily J, PA-C  traZODone (DESYREL) 100 MG tablet Take 100 mg by mouth at bedtime.    [provider]    Family History No family history on file.  Social History Social History   Tobacco Use  . Smoking status: Current Every Day Smoker    Packs/day: 2.00    Types: Cigarettes  . Smokeless tobacco: Never Used  Substance Use Topics  . Alcohol use: No    Frequency: Never  . Drug use: Not on file     Allergies   Patient has no known allergies.   Review of Systems Review of Systems   Physical Exam Updated Vital Signs BP 117/84 (BP Location: Left Arm)   Pulse 69   Temp 97.9 F (36.6 C) (Oral)   Resp 16   Ht 5\' 7"  (1.702 m)   Wt 56.7 kg   SpO2 100%   BMI 19.58 kg/m    Physical Exam   ED Treatments / Results  Labs (all labs ordered are listed, but only abnormal results are displayed) Labs Reviewed - No data to display  EKG  EKG Interpretation None       Radiology No results found.  Procedures .Suture Removal Date/Time: 03/04/2017 11:08 AM Performed by: Elly ModenaHenson, Deauna Yaw M, Student-PA Authorized by: Emi HolesLaw, Alexandra M, PA-C   Consent:    Consent obtained:  Written   Consent given by:  Patient   Risks discussed:  Bleeding, pain and wound separation   Alternatives discussed:  No treatment and delayed treatment Universal protocol:    Procedure explained and questions answered to patient or proxy's satisfaction: yes     Relevant documents present and verified: yes     Test results available and properly labeled: yes     Patient identity confirmed:  Verbally with patient Location:    Location:  Head/neck   Head/neck location:  Eyebrow   Eyebrow location:  L eyebrow Procedure details:    Wound appearance:  No signs of infection   Number of sutures removed:  8 Post-procedure details:    Post-removal:  Antibiotic ointment applied   Patient tolerance  of procedure:  Tolerated well, no immediate complications   (including critical care time)  Medications Ordered in ED Medications  bacitracin ointment (1 application Topical Given 03/04/17 1031)     Initial Impression / Assessment and Plan / ED Course  I have reviewed the triage vital signs and the nursing notes.  Pertinent labs & imaging results that were available during my care of the patient were reviewed by me and considered in my medical decision making (see chart for details).     Patient WDWN A&Ox4 in no acute distress wound shows no sign of infection.   Final Clinical Impressions(s) / ED Diagnoses   Final diagnoses:  Encounter for removal of sutures  Neck muscle spasm    New Prescriptions Discharge Medication List as of 03/04/2017 10:02 AM    START taking these  medications   Details  methocarbamol (ROBAXIN) 500 MG tablet Take 1 tablet (500 mg total) by mouth 2 (two) times daily., Starting Tue 03/04/2017, Print

## 2017-03-04 NOTE — ED Notes (Signed)
Pt reports he was seen here for a fall from a 6 ft ladder last Sunday. C/O continued neck pain. Pt also wants sutures removed from left eye that were placed last Sunday.

## 2017-03-04 NOTE — ED Provider Notes (Signed)
[facility] Provider Note   CSN: [id number] Arrival date & time: 03/04/17  [id number]     History   Chief Complaint Chief Complaint  Patient presents with  . Suture / Staple Removal    HPI [name] is a 67 y.o. male with history of hypercholesterolemia who presents following fall for suture removal of left eyebrow laceration.  He reports it has been healing well.  He returned 4 days ago and sutures were not ready to taken out.  At that time, he reported that he was having intermittent shooting pains coming from his right sided neck.  His pains are worse with certain movements.  Tramadol and Flexeril were prescribed.  He continues to have the shooting pains and is not feel the medicines are working very well.  CT C-spine was conducted at his initial visit which showed no acute findings, but degenerative disc disease.  He denies any numbness or tingling in his upper extremities.  HPI  Past Medical History:  Diagnosis Date  . Hypercholesteremia     Patient Active Problem List   Diagnosis Date Noted  . Acute medial meniscus tear of right knee 02/04/2017    Past Surgical History:  Procedure Laterality Date  . ELBOW SURGERY Right   . SHOULDER SURGERY Left   . TRACHEOSTOMY         Home Medications    Prior to Admission medications   Medication Sig Start Date End Date Taking? Authorizing Provider  Cholecalciferol (VITAMIN D) 2000 units tablet Take 2,000 Units by mouth daily.    [provider]  methocarbamol (ROBAXIN) 500 MG tablet Take 1 tablet (500 mg total) by mouth 2 (two) times daily. 03/04/17   Khallid Pasillas, Waylan BogaAlexandra M, PA-C  simvastatin (ZOCOR) 80 MG tablet Take 80 mg by mouth at bedtime.    [provider]  traMADol (ULTRAM) 50 MG tablet Take 1 tablet (50 mg total) by mouth every 6 (six) hours as needed. 02/28/17   Kellie ShropshireShrosbree, Emily J, PA-C  traZODone (DESYREL) 100 MG tablet Take 100 mg by mouth at bedtime.    [provider]    Family History No family history on file.  Social History Social History   Tobacco Use  . Smoking status: Current Every Day Smoker    Packs/day: 2.00    Types: Cigarettes  . Smokeless tobacco: Never Used  Substance Use Topics  . Alcohol use: No    Frequency: Never  . Drug use: Not on file     Allergies   Patient has no known allergies.   Review of Systems Review of Systems  Musculoskeletal: Positive for neck pain.  Skin: Positive for wound.  Neurological: Negative for numbness.     Physical Exam Updated Vital Signs BP 117/84 (BP Location: Left Arm)   Pulse 69   Temp 97.9 F (36.6 C) (Oral)   Resp 16   Ht 5\' 7"  (1.702 m)   Wt 56.7 kg (125 lb)   SpO2 100%   BMI 19.58 kg/m   Physical Exam  Constitutional: He appears well-developed and well-nourished. No distress.  HENT:  Head: Normocephalic and atraumatic.  Mouth/Throat: Oropharynx is clear and moist. No oropharyngeal exudate.  Eyes: Conjunctivae are normal. Pupils are equal, round, and reactive to light. Right eye exhibits no discharge. Left eye exhibits no discharge. No scleral icterus.  Neck: Normal range of motion. Neck supple. Muscular tenderness present. No spinous process tenderness present. Normal range of motion present.  Cardiovascular: Normal rate, regular rhythm, normal heart sounds and intact distal pulses. Exam reveals no gallop and no friction rub.  No murmur heard. Pulmonary/Chest: Effort normal and breath sounds normal. No stridor. No respiratory distress. He has no wheezes. He has no rales.  Musculoskeletal: He exhibits no edema.  Neurological: He is alert. Coordination normal.  Skin: Skin is warm and dry. No rash noted. He is not diaphoretic. No pallor.  Well-healing laceration through the left eyebrow, sutures in place, no erythema or drainage  Psychiatric: He has a normal mood and affect.  Nursing note and vitals reviewed.    ED Treatments / Results  Labs (all  labs ordered are listed, but only abnormal results are displayed) Labs Reviewed - No data to display  EKG  EKG Interpretation None       Radiology No results found.  Procedures .Suture Removal Date/Time: 03/04/2017 10:47 AM Performed by: Emi Holes, PA-C Authorized by: Emi Holes, PA-C   Consent:    Consent obtained:  Verbal   Consent given by:  Patient   Risks discussed:  Bleeding and pain   Alternatives discussed:  No treatment Location:    Location:  Head/neck   Head/neck location:  Eyebrow   Eyebrow location:  L eyebrow Procedure details:    Wound appearance:  No signs of infection, good wound healing and clean   Number of sutures removed:  8 Post-procedure details:    Post-removal:  Antibiotic ointment applied   Patient tolerance of procedure:  Tolerated well, no immediate complications   (including critical care time)  Medications Ordered in ED Medications - No data to display   Initial Impression / Assessment and Plan / ED Course  I have reviewed the triage vital signs and the nursing notes.  Pertinent labs & imaging results that were available during my care of the patient were reviewed by me and considered in my medical decision making (see chart for details).     Pt to ER for suture removal and wound check as above. Procedure tolerated well. Vitals normal, no signs of infection. Will also change Flexeril to Robaxin for patient's neck spasms. No midline tenderness. Neurovascularly intact. Supportive treatment discussed including stretching and ice/heat. Return precautions discussed. Patient understands and agrees with plan. Patient vitals stable throughout ED course and discharged in satisfactory condition.   Final Clinical Impressions(s) / ED Diagnoses   Final diagnoses:  Encounter for removal of sutures  Neck muscle spasm    ED Discharge Orders        Ordered    methocarbamol (ROBAXIN) 500 MG tablet  2 times daily     03/04/17 1000        Emi Holes, PA-C 03/04/17 2215    Mancel Bale, MD 03/05/17 [phone]

## 2017-03-04 NOTE — ED Triage Notes (Signed)
Pt admits for suture removal to L eyebrow, stiches not ready to come out when last seen so he returns today. Continues to complain of neck pain.

## 2017-03-04 NOTE — Discharge Instructions (Signed)
Medications: Robaxin  Treatment: Stop taking Flexeril. Take Robaxin twice daily as needed for your neck pain. Do not drive or operate machinery while taking this medication.  Use a heating pad to your neck 3-4 times daily alternating 15 minutes on, 15 minutes off.  Gently stretch several times daily.  Follow-up: [facility] are not improving over the next week.  Please return to emergency department if you develop any increasing pain, redness, swelling, drainage, red streaking from your wound.

## 2017-08-27 ENCOUNTER — Other Ambulatory Visit: Payer: Self-pay | Admitting: Internal Medicine

## 2017-08-27 DIAGNOSIS — I739 Peripheral vascular disease, unspecified: Secondary | ICD-10-CM

## 2017-09-03 ENCOUNTER — Telehealth (INDEPENDENT_AMBULATORY_CARE_PROVIDER_SITE_OTHER): Payer: Self-pay | Admitting: Orthopaedic Surgery

## 2017-09-03 NOTE — Telephone Encounter (Signed)
Patient came into the office to check on the status of his surgery with Dr. Roda ShuttersXu.  He wanted to know if the [facility] to get [facility].  CB#[phone].  Thank you.

## 2017-09-08 ENCOUNTER — Other Ambulatory Visit: Payer: Non-veteran care

## 2017-09-10 ENCOUNTER — Ambulatory Visit
Admission: RE | Admit: 2017-09-10 | Discharge: 2017-09-10 | Disposition: A | Discharge status: Home / Self Care | Payer: Non-veteran care | Source: Ambulatory Visit | Attending: Internal Medicine | Admitting: Internal Medicine

## 2017-09-10 DIAGNOSIS — I739 Peripheral vascular disease, unspecified: Secondary | ICD-10-CM

## 2017-12-25 ENCOUNTER — Emergency Department (HOSPITAL_COMMUNITY): Payer: Non-veteran care

## 2017-12-25 ENCOUNTER — Emergency Department (HOSPITAL_COMMUNITY)
Admission: EM | Admit: 2017-12-25 | Discharge: 2017-12-25 | Disposition: A | Discharge status: Home / Self Care | Payer: Non-veteran care | Attending: Emergency Medicine | Admitting: Emergency Medicine

## 2017-12-25 ENCOUNTER — Encounter (HOSPITAL_COMMUNITY): Payer: Self-pay | Admitting: Emergency Medicine

## 2017-12-25 ENCOUNTER — Other Ambulatory Visit: Payer: Self-pay

## 2017-12-25 DIAGNOSIS — F17210 Nicotine dependence, cigarettes, uncomplicated: Secondary | ICD-10-CM | POA: Insufficient documentation

## 2017-12-25 DIAGNOSIS — M7918 Myalgia, other site: Secondary | ICD-10-CM | POA: Diagnosis not present

## 2017-12-25 DIAGNOSIS — R51 Headache: Secondary | ICD-10-CM | POA: Diagnosis present

## 2017-12-25 DIAGNOSIS — Y999 Unspecified external cause status: Secondary | ICD-10-CM | POA: Diagnosis not present

## 2017-12-25 DIAGNOSIS — Y9389 Activity, other specified: Secondary | ICD-10-CM | POA: Diagnosis not present

## 2017-12-25 DIAGNOSIS — Z79899 Other long term (current) drug therapy: Secondary | ICD-10-CM | POA: Insufficient documentation

## 2017-12-25 DIAGNOSIS — Y92410 Unspecified street and highway as the place of occurrence of the external cause: Secondary | ICD-10-CM | POA: Insufficient documentation

## 2017-12-25 MED ORDER — METHOCARBAMOL 500 MG PO TABS: 500.0000 mg | ORAL_TABLET | Freq: Two times a day (BID) | ORAL | 0 refills | Status: DC

## 2017-12-25 MED ORDER — IBUPROFEN 400 MG PO TABS
600.0000 mg | ORAL_TABLET | Freq: Once | ORAL | Status: AC
  Administered 2017-12-25: 20:00:00 600 mg via ORAL
  Filled 2017-12-25: qty 1
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
## 2017-12-25 NOTE — Discharge Instructions (Addendum)
Please read attached information. If you experience any new or worsening signs or symptoms please return to the emergency room for evaluation. Please follow-up with your primary care provider or specialist as discussed. Please use medication prescribed only as directed and discontinue taking if you have any concerning signs or symptoms.   °
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
## 2017-12-25 NOTE — ED Notes (Signed)
Pt returned from xray

## 2018-06-08 ENCOUNTER — Emergency Department (HOSPITAL_COMMUNITY): Payer: No Typology Code available for payment source

## 2018-06-08 ENCOUNTER — Emergency Department (HOSPITAL_COMMUNITY)
Admission: EM | Admit: 2018-06-08 | Discharge: 2018-06-08 | Disposition: A | Discharge status: Home / Self Care | Payer: No Typology Code available for payment source | Attending: Emergency Medicine | Admitting: Emergency Medicine

## 2018-06-08 ENCOUNTER — Other Ambulatory Visit: Payer: Self-pay

## 2018-06-08 DIAGNOSIS — Z79899 Other long term (current) drug therapy: Secondary | ICD-10-CM | POA: Diagnosis not present

## 2018-06-08 DIAGNOSIS — F17210 Nicotine dependence, cigarettes, uncomplicated: Secondary | ICD-10-CM | POA: Insufficient documentation

## 2018-06-08 DIAGNOSIS — Y9289 Other specified places as the place of occurrence of the external cause: Secondary | ICD-10-CM | POA: Insufficient documentation

## 2018-06-08 DIAGNOSIS — Y999 Unspecified external cause status: Secondary | ICD-10-CM | POA: Diagnosis not present

## 2018-06-08 DIAGNOSIS — X500XXA Overexertion from strenuous movement or load, initial encounter: Secondary | ICD-10-CM | POA: Insufficient documentation

## 2018-06-08 DIAGNOSIS — M25512 Pain in left shoulder: Secondary | ICD-10-CM | POA: Diagnosis not present

## 2018-06-08 DIAGNOSIS — S4992XA Unspecified injury of left shoulder and upper arm, initial encounter: Secondary | ICD-10-CM | POA: Diagnosis present

## 2018-06-08 DIAGNOSIS — Y9389 Activity, other specified: Secondary | ICD-10-CM | POA: Insufficient documentation

## 2018-06-08 NOTE — Discharge Instructions (Signed)
Shoulder x-ray today looked ok-- lots of arthritis present. It looks like you have seen Dr. Roda Shutters in the past-- can follow-up with him in clinic.  I have attached his contact information. You can return here for any new/acute changes.
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
## 2018-06-08 NOTE — ED Notes (Signed)
Patient verbalizes understanding of discharge instructions. Opportunity for questioning and answers were provided. Armband removed by staff, pt discharged from ED.  
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
## 2020-08-22 ENCOUNTER — Ambulatory Visit (INDEPENDENT_AMBULATORY_CARE_PROVIDER_SITE_OTHER): Payer: No Typology Code available for payment source | Admitting: Vascular Surgery

## 2020-08-22 ENCOUNTER — Other Ambulatory Visit (HOSPITAL_COMMUNITY): Payer: Self-pay | Admitting: Vascular Surgery

## 2020-08-22 ENCOUNTER — Other Ambulatory Visit: Payer: Self-pay

## 2020-08-22 ENCOUNTER — Ambulatory Visit (HOSPITAL_COMMUNITY)
Admission: RE | Admit: 2020-08-22 | Discharge: 2020-08-22 | Disposition: A | Discharge status: Home / Self Care | Payer: No Typology Code available for payment source | Source: Ambulatory Visit | Attending: Vascular Surgery | Admitting: Vascular Surgery

## 2020-08-22 ENCOUNTER — Encounter: Payer: Self-pay | Admitting: Vascular Surgery

## 2020-08-22 VITALS — BP 99/69 | HR 69 | Temp 98.5°F | Resp 20 | Ht 65.0 in | Wt 109.0 lb

## 2020-08-22 DIAGNOSIS — I7090 Unspecified atherosclerosis: Secondary | ICD-10-CM

## 2020-08-22 DIAGNOSIS — I7409 Other arterial embolism and thrombosis of abdominal aorta: Secondary | ICD-10-CM | POA: Diagnosis not present

## 2020-08-22 NOTE — Progress Notes (Signed)
[facility] [place]  ASSESSMENT / PLAN: Adrian Washington is a 70 y.o. 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  Recommend the following which can slow the progression of atherosclerosis and reduce the risk of major adverse cardiac / limb events:  Complete cessation from all tobacco products. Blood glucose control with goal A1c < 7%. Blood pressure control with goal blood pressure < 140/90 mmHg. Lipid reduction therapy with goal LDL-C <100 mg/dL (<38 if symptomatic from PAD).  Aspirin 81mg  PO QD.  Atorvastatin 40-80mg  PO QD (or other "high intensity" statin therapy).  Check CT angiogram of abdomen and pelvis with runoff to evaluate aortoiliac occlusive disease. He is malnourished which will need to be considered prior to any open intervention. Follow up with me as soon as possible after CT angiogram.  CHIEF COMPLAINT: bilateral leg pain  HISTORY OF PRESENT ILLNESS: Adrian Washington is a 70 y.o. male referred to clinic for evaluation of bilateral lower extremity pain.  Patient reports he has significant pain in his calves and thighs when he walks.  He also reports some pain at rest in his feet.  Pain at rest is relieved by dependency.  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  VASCULAR SURGICAL HISTORY: prior infrainguinal stenting - patient unclear of details  VASCULAR RISK FACTORS: Negative history of cerebrovascular disease / stroke / transient ischemic attack. Negative history of coronary artery disease. Negative history of diabetes mellitus.  Positive history of smoking. Not actively smoking. Negative history of hypertension.  Negative history of chronic kidney disease. Last GFR > 60 in [place] system.  Negative history of chronic obstructive pulmonary disease.  Past Medical History:  Diagnosis Date   Hypercholesteremia     Past Surgical  History:  Procedure Laterality Date   ELBOW SURGERY Right    SHOULDER SURGERY Left    TRACHEOSTOMY      History reviewed. No pertinent family history.  Social History   Socioeconomic History   Marital status: Single    Spouse name: Not on file   Number of children: Not on file   Years of education: Not on file   Highest education level: Not on file  Occupational History   Not on file  Tobacco Use   Smoking status: Former    Packs/day: 2.00    Pack years: 0.00    Types: Cigarettes    Quit date: 07/20/2020    Years since quitting: 0.0   Smokeless tobacco: Never  Substance and Sexual Activity   Alcohol use: Yes   Drug use: Never   Sexual activity: Not on file  Other Topics Concern   Not on file  Social History Narrative   Not on file   Social Determinants of Health   Financial Resource Strain: Not on file  Food Insecurity: Not on file  Transportation Needs: Not on file  Physical Activity: Not on file  Stress: Not on file  Social Connections: Not on file  Intimate Partner Violence: Not on file    No Known Allergies  Current Outpatient Medications  Medication Sig Dispense Refill   atorvastatin (LIPITOR) 40 MG tablet Take by mouth.     Cholecalciferol (VITAMIN D) 2000 units tablet Take 2,000 Units by mouth daily.     methocarbamol (ROBAXIN) 500 MG tablet Take 1 tablet (500 mg total) by mouth 2 (two) times daily. 20  tablet 0   mirtazapine (REMERON) 45 MG tablet Take by mouth.     sertraline (ZOLOFT) 50 MG tablet Take by mouth.     sildenafil (VIAGRA) 100 MG tablet Take by mouth.     simvastatin (ZOCOR) 80 MG tablet Take 80 mg by mouth at bedtime.     traMADol (ULTRAM) 50 MG tablet Take 1 tablet (50 mg total) by mouth every 6 (six) hours as needed. 15 tablet 0   traZODone (DESYREL) 100 MG tablet Take 100 mg by mouth at bedtime.     No current facility-administered medications for this visit.    REVIEW OF SYSTEMS:  [X]  denotes positive finding, [ ]  denotes  negative finding Cardiac  Comments:  Chest pain or chest pressure:    Shortness of breath upon exertion:    Short of breath when lying flat:    Irregular heart rhythm:        Vascular    Pain in calf, thigh, or hip brought on by ambulation: x   Pain in feet at night that wakes you up from your sleep:  x   Blood clot in your veins:    Leg swelling:         Pulmonary    Oxygen at home:    Productive cough:     Wheezing:         Neurologic    Sudden weakness in arms or legs:     Sudden numbness in arms or legs:     Sudden onset of difficulty speaking or slurred speech:    Temporary loss of vision in one eye:     Problems with dizziness:         Gastrointestinal    Blood in stool:     Vomited blood:         Genitourinary    Burning when urinating:     Blood in urine:        Psychiatric    Major depression:         Hematologic    Bleeding problems:    Problems with blood clotting too easily:        Skin    Rashes or ulcers:        Constitutional    Fever or chills:      PHYSICAL EXAM Vitals:   08/22/20 1559  BP: 99/69  Pulse: 69  Resp: 20  Temp: 98.5 F (36.9 C)  SpO2: 95%  Weight: 109 lb (49.4 kg)  Height: 5\' 5"  (1.651 m)    Constitutional: well appearing. no distress. Appears malnourished.  Neurologic: CN intact. no focal findings. no sensory loss. Psychiatric:  Mood and affect symmetric and appropriate. Eyes: No icterus. No conjunctival pallor. Ears, nose, throat:  mucous membranes moist. Midline trachea.  Cardiac: regular rate and rhythm.  Respiratory:  unlabored. Abdominal:  soft, non-tender, non-distended.  Peripheral vascular:  Absent femoral pulses  Absent popliteal pulses  Absent pedal pulses Extremity: no edema. no cyanosis. no pallor.  Skin: no gangrene. no ulceration.  Lymphatic: no Stemmer's sign. no palpable lymphadenopathy.  PERTINENT LABORATORY AND RADIOLOGIC DATA ABI 08/22/20   10/23/20. , MD [facility] Phone Number: [phone] 08/22/2020 4:19 PM

## 2020-08-25 ENCOUNTER — Other Ambulatory Visit: Payer: Self-pay

## 2020-08-25 DIAGNOSIS — I7409 Other arterial embolism and thrombosis of abdominal aorta: Secondary | ICD-10-CM

## 2020-08-31 ENCOUNTER — Ambulatory Visit (HOSPITAL_COMMUNITY)
Admission: RE | Admit: 2020-08-31 | Discharge: 2020-08-31 | Disposition: A | Discharge status: Home / Self Care | Payer: No Typology Code available for payment source | Source: Ambulatory Visit | Attending: Vascular Surgery | Admitting: Vascular Surgery

## 2020-08-31 ENCOUNTER — Other Ambulatory Visit: Payer: Self-pay

## 2020-08-31 DIAGNOSIS — I7409 Other arterial embolism and thrombosis of abdominal aorta: Secondary | ICD-10-CM | POA: Insufficient documentation

## 2020-08-31 LAB — POCT I-STAT CREATININE: Creatinine, Ser: 0.6 mg/dL — ABNORMAL LOW (ref 0.61–1.24)

## 2020-08-31 MED ORDER — IOHEXOL 350 MG/ML SOLN
90.0000 mL | Freq: Once | INTRAVENOUS | Status: AC | PRN
  Administered 2020-08-31: 90 mL via INTRAVENOUS

## 2020-09-04 NOTE — Progress Notes (Signed)
[facility] [place]  ASSESSMENT / PLAN: Adrian Washington is a 70 y.o. male with aortoiliac occlusive disease causing bilateral lower extremity rest pain.  Recommend the following which can slow the progression of atherosclerosis and reduce the risk of major adverse cardiac / limb events:  Complete cessation from all tobacco products. Blood glucose control with goal A1c < 7%. Blood pressure control with goal blood pressure < 140/90 mmHg. Lipid reduction therapy with goal LDL-C <100 mg/dL (<41 if symptomatic from PAD).  Aspirin 81mg  PO QD.  Atorvastatin 40-80mg  PO QD (or other "high intensity" statin therapy).  CT angiogram reveals complex disease including aortoiliac occlusion and severe atherosclerosis of bilateral common femoral arteries. 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 This will need to be better understood before committing him to a laparotomy and making any oncologic resection difficult. Check MRI abdomen with contrast. Will refer to Dr. .   CHIEF COMPLAINT: bilateral leg pain  HISTORY OF PRESENT ILLNESS: Adrian Washington is a 70 y.o. male referred to clinic for evaluation of bilateral lower extremity pain.  Patient reports he has significant pain in his calves and thighs when he walks.  He also reports some pain at rest in his feet.  Pain at rest is relieved by dependency.  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  09/04/20: patient returns to discuss CTA findings. Our office is not functional at the moment, so we discussed the CT findings face to face.   VASCULAR SURGICAL HISTORY: prior infrainguinal stenting - patient unclear of details  VASCULAR RISK FACTORS: Negative history of cerebrovascular disease / stroke / transient ischemic attack. Negative history of coronary artery disease. Negative history of diabetes mellitus.  Positive history of smoking. Not  actively smoking. Negative history of hypertension.  Negative history of chronic kidney disease. Last GFR > 60 in 09/06/20 system.  Negative history of chronic obstructive pulmonary disease.  Past Medical History:  Diagnosis Date   Hypercholesteremia     Past Surgical History:  Procedure Laterality Date   ELBOW SURGERY Right    SHOULDER SURGERY Left    TRACHEOSTOMY      No family history on file.  Social History   Socioeconomic History   Marital status: Single    Spouse name: Not on file   Number of children: Not on file   Years of education: Not on file   Highest education level: Not on file  Occupational History   Not on file  Tobacco Use   Smoking status: Former    Packs/day: 2.00    Types: Cigarettes    Quit date: 07/20/2020    Years since quitting: 0.1   Smokeless tobacco: Never  Substance and Sexual Activity   Alcohol use: Yes   Drug use: Never   Sexual activity: Not on file  Other Topics Concern   Not on file  Social History Narrative   Not on file   Social Determinants of Health   Financial Resource Strain: Not on file  Food Insecurity: Not on file  Transportation Needs: Not on file  Physical Activity: Not on file  Stress: Not on file  Social Connections: Not on file  Intimate Partner Violence: Not on file    No Known Allergies  Current Outpatient Medications  Medication Sig Dispense Refill   atorvastatin (LIPITOR) 40 MG tablet Take by mouth.  Cholecalciferol (VITAMIN D) 2000 units tablet Take 2,000 Units by mouth daily.     methocarbamol (ROBAXIN) 500 MG tablet Take 1 tablet (500 mg total) by mouth 2 (two) times daily. 20 tablet 0   mirtazapine (REMERON) 45 MG tablet Take by mouth.     sertraline (ZOLOFT) 50 MG tablet Take by mouth.     sildenafil (VIAGRA) 100 MG tablet Take by mouth.     simvastatin (ZOCOR) 80 MG tablet Take 80 mg by mouth at bedtime.     traMADol (ULTRAM) 50 MG tablet Take 1 tablet (50 mg total) by mouth every 6 (six) hours as  needed. 15 tablet 0   traZODone (DESYREL) 100 MG tablet Take 100 mg by mouth at bedtime.     No current facility-administered medications for this visit.    REVIEW OF SYSTEMS:  [X]  denotes positive finding, [ ]  denotes negative finding Cardiac  Comments:  Chest pain or chest pressure:    Shortness of breath upon exertion:    Short of breath when lying flat:    Irregular heart rhythm:        Vascular    Pain in calf, thigh, or hip brought on by ambulation: x   Pain in feet at night that wakes you up from your sleep:  x   Blood clot in your veins:    Leg swelling:         Pulmonary    Oxygen at home:    Productive cough:     Wheezing:         Neurologic    Sudden weakness in arms or legs:     Sudden numbness in arms or legs:     Sudden onset of difficulty speaking or slurred speech:    Temporary loss of vision in one eye:     Problems with dizziness:         Gastrointestinal    Blood in stool:     Vomited blood:         Genitourinary    Burning when urinating:     Blood in urine:        Psychiatric    Major depression:         Hematologic    Bleeding problems:    Problems with blood clotting too easily:        Skin    Rashes or ulcers:        Constitutional    Fever or chills:      PHYSICAL EXAM Not performed today.  Office not functional.  PERTINENT LABORATORY AND RADIOLOGIC DATA ABI 08/22/20   CLINICAL DATA:  Claudication   Peripheral arterial disease   EXAM: CT ANGIOGRAPHY OF ABDOMINAL AORTA WITH ILIOFEMORAL RUNOFF   TECHNIQUE: Multidetector CT imaging of the abdomen, pelvis and lower extremities was performed using the standard protocol during bolus administration of intravenous contrast. Multiplanar CT image reconstructions and MIPs were obtained to evaluate the vascular anatomy.   CONTRAST:  37mL OMNIPAQUE IOHEXOL 350 MG/ML SOLN   COMPARISON:  None.   FINDINGS: VASCULAR   Aorta: Distal infrarenal abdominal aorta is occluded.  No  aneurysm.   Celiac: No significant abnormality.  No significant stenosis.   SMA: No significant stenosis.  No signal abnormality.   Renals: Both renal arteries are patent without evidence of aneurysm, dissection, vasculitis, fibromuscular dysplasia or significant stenosis.   IMA: Severe stenosis at the origin due to calcified plaque. Otherwise patent.   RIGHT Lower Extremity   Inflow:  Common and external iliac arteries are occluded. Proximal internal iliac artery is occluded. There is opacification of distal branches consistent with retrograde collateral flow.   Outflow: Flow reconstitutes within a heavily calcified and narrowed common femoral artery. Near complete occlusion at the origin of the profundus femoris and superficial femoral arteries. Both superficial femoral and profunda femoris arteries are densely calcified in their proximal segments. The Profunda femoris artery is patent. The superficial femoral artery is densely calcified with multiple foci of complete to near complete occlusion in its proximal and mid 1/3. The distal 1/3 appears occluded. Right popliteal artery predominately appears occluded. There is a thin opacifying channel just above the tibioperoneal trunk.   Runoff: Tibioperoneal trunk is diffusely narrowed with dense atheromatous plaque. The posterior tibial artery is occluded. The anterior tibial artery is diffusely calcified and narrowed but is likely patent. The peroneal artery is patent.   LEFT Lower Extremity   Inflow: Common and external iliac arteries are occluded. External iliac artery stent is noted. Proximal internal iliac artery is occluded. Opacification of distal internal iliac artery branches consistent with retrograde collateral flow.   Outflow: Flow reconstitutes within a heavily calcified and diffusely narrowed common femoral artery. Near complete occlusion at the origin of the Profunda femoris artery which is patent.  Left superficial femoral artery is densely calcified and predominantly occluded. Flow reconstitutes through prominent muscular collaterals from the Profunda femoris artery at the level of the popliteal artery. The popliteal artery is diffusely calcified and severely narrowed, but remains patent.   Runoff: Severe stenosis of the tibioperoneal trunk due to bulky calcified atheromatous plaque. There is 2 vessel runoff to the left ankle through the posterior tibial and peroneal arteries. The anterior tibial artery is occluded. The peroneal artery is diffusely calcified and narrowed.   Veins: No obvious venous abnormality within the limitations of this arterial phase study.   Review of the MIP images confirms the above findings.   NON-VASCULAR   Lower chest: No acute abnormality.   Hepatobiliary: 1.4 cm subtly enhancing lesion seen in the posterior right hepatic lobe (image 43, series 4). Gallbladder not well evaluated due to collapse configuration.   Pancreas: Unremarkable. No pancreatic ductal dilatation or surrounding inflammatory changes.   Spleen: Normal in size without focal abnormality.   Adrenals/Urinary Tract: Adrenal glands are unremarkable. Subcentimeter right renal hypodensity is too small to fully characterize. Kidneys in ureters otherwise unremarkable. No significant abnormality of the bladder.   Stomach/Bowel: No bowel dilatation to indicate ileus or obstruction.   Lymphatic: No enlarged abdominal or pelvic lymph nodes.   Reproductive: Large right hydrocele.  Prostate is unremarkable.   Other: No abdominal wall hernia or abnormality. No abdominopelvic ascites.   Musculoskeletal: Remote healed fractures noted at the posterior segments of multiple right lower ribs. 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   IMPRESSION: VASCULAR   1. Occluded distal infrarenal abdominal aorta, bilateral common iliac, external  iliac, and proximal internal iliac arteries. Given well-established collateral arteries in the anterior abdominal wall and perirectal region findings are consistent with chronic occlusion. 2. Occluded left superficial femoral artery. Severe stenosis of the left common femoral artery and origin of left Profunda femoris artery. Diffuse severe stenosis of the left popliteal artery and tibioperoneal trunk. Two vessel runoff to the left ankle through the posterior tibial and peroneal arteries. 3. Severely narrowed right common femoral artery. Severe stenosis at the origin of the right profunda femoris artery. Severe long segment  stenosis with multiple foci of near complete occlusion of the right superficial femoral artery in the proximal and mid 1/3. The distal 1/3 of the SFA appears occluded. Right popliteal artery is occluded in its proximal segment with thin patent channel in the distal most segment. Right tibioperoneal trunk is diffusely narrowed. Two vessel runoff to the right ankle through the anterior tibial and peroneal arteries.   NON-VASCULAR   1.4 cm subtly enhancing lesion in the posterior right hepatic lobe (43/4) requires further evaluation with contrast enhanced abdominal MRI.     Electronically Signed   By: Acquanetta Belling M.D.   On: 09/01/2020 09:49   Rande Brunt. Lenell Antu, MD [facility] Phone Number: [phone] 09/04/2020 6:48 PM

## 2020-09-05 ENCOUNTER — Other Ambulatory Visit: Payer: Self-pay

## 2020-09-05 ENCOUNTER — Ambulatory Visit (INDEPENDENT_AMBULATORY_CARE_PROVIDER_SITE_OTHER): Payer: No Typology Code available for payment source | Admitting: Vascular Surgery

## 2020-09-05 ENCOUNTER — Other Ambulatory Visit: Payer: Self-pay | Admitting: Vascular Surgery

## 2020-09-05 DIAGNOSIS — I7409 Other arterial embolism and thrombosis of abdominal aorta: Secondary | ICD-10-CM

## 2020-09-05 DIAGNOSIS — K769 Liver disease, unspecified: Secondary | ICD-10-CM

## 2020-09-13 ENCOUNTER — Other Ambulatory Visit: Payer: Self-pay

## 2020-09-13 DIAGNOSIS — K769 Liver disease, unspecified: Secondary | ICD-10-CM

## 2020-09-13 DIAGNOSIS — I7409 Other arterial embolism and thrombosis of abdominal aorta: Secondary | ICD-10-CM

## 2020-09-14 ENCOUNTER — Other Ambulatory Visit: Payer: Self-pay

## 2020-09-14 ENCOUNTER — Ambulatory Visit
Admission: RE | Admit: 2020-09-14 | Discharge: 2020-09-14 | Disposition: A | Discharge status: Home / Self Care | Payer: No Typology Code available for payment source | Source: Ambulatory Visit | Attending: Vascular Surgery | Admitting: Vascular Surgery

## 2020-09-14 DIAGNOSIS — I7409 Other arterial embolism and thrombosis of abdominal aorta: Secondary | ICD-10-CM

## 2020-09-14 MED ORDER — GADOBENATE DIMEGLUMINE 529 MG/ML IV SOLN
10.0000 mL | Freq: Once | INTRAVENOUS | Status: AC | PRN
  Administered 2020-09-14: 10 mL via INTRAVENOUS

## 2020-09-16 ENCOUNTER — Other Ambulatory Visit: Payer: No Typology Code available for payment source

## 2020-09-18 NOTE — H&P (View-Only) (Signed)
[facility]  ASSESSMENT / PLAN: Adrian Washington is a 70 y.o. male with aortoiliac occlusive disease causing bilateral lower extremity rest pain.  Recommend the following which can slow the progression of atherosclerosis and reduce the risk of major adverse cardiac / limb events:  Complete cessation from all tobacco products. Blood glucose control with goal A1c < 7%. Blood pressure control with goal blood pressure < 140/90 mmHg. Lipid reduction therapy with goal LDL-C <100 mg/dL (<50 if symptomatic from PAD).  Aspirin 81mg  PO QD.  Atorvastatin 40-80mg  PO QD (or other "high intensity" statin therapy).  CT angiogram reveals complex disease including aortoiliac occlusion and severe atherosclerosis of bilateral common femoral arteries. 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 Plan to proceed with aortobifemoral bypass. Will need preop cardiac risk stratification.   CHIEF COMPLAINT: bilateral leg pain  HISTORY OF PRESENT ILLNESS: Adrian Washington is a 70 y.o. male referred to clinic for evaluation of bilateral lower extremity pain.  Patient reports he has significant pain in his calves and thighs when he walks.  He also reports some pain at rest in his feet.  Pain at rest is relieved by dependency.  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  09/04/20: patient returns to discuss CTA findings. Our office is not functional at the moment, so we discussed the CT findings face to face.   09/19/20: returns to discuss MR findings. We reviewed risks / benefits / alternatives.  VASCULAR SURGICAL HISTORY: prior infrainguinal stenting - patient unclear of details  VASCULAR RISK FACTORS: Negative history of cerebrovascular disease / stroke / transient ischemic attack. Negative history of coronary artery disease. Negative history of diabetes mellitus.  Positive history of smoking. Not actively  smoking. Negative history of hypertension.  Negative history of chronic kidney disease. Last GFR > 60 in 11/19/20 system.  Negative history of chronic obstructive pulmonary disease.  Past Medical History:  Diagnosis Date   Hypercholesteremia     Past Surgical History:  Procedure Laterality Date   ELBOW SURGERY Right    SHOULDER SURGERY Left    TRACHEOSTOMY      History reviewed. No pertinent family history.  Social History   Socioeconomic History   Marital status: Single    Spouse name: Not on file   Number of children: Not on file   Years of education: Not on file   Highest education level: Not on file  Occupational History   Not on file  Tobacco Use   Smoking status: Former    Packs/day: 2.00    Types: Cigarettes    Quit date: 07/20/2020    Years since quitting: 0.1   Smokeless tobacco: Never  Substance and Sexual Activity   Alcohol use: Yes   Drug use: Never   Sexual activity: Not on file  Other Topics Concern   Not on file  Social History Narrative   Not on file   Social Determinants of Health   Financial Resource Strain: Not on file  Food Insecurity: Not on file  Transportation Needs: Not on file  Physical Activity: Not on file  Stress: Not on file  Social Connections: Not on file  Intimate Partner Violence: Not on file    No Known Allergies  Current Outpatient Medications  Medication Sig Dispense Refill   atorvastatin (LIPITOR) 40 MG tablet Take by mouth.     Cholecalciferol (VITAMIN D) 2000 units  tablet Take 2,000 Units by mouth daily.     methocarbamol (ROBAXIN) 500 MG tablet Take 1 tablet (500 mg total) by mouth 2 (two) times daily. 20 tablet 0   mirtazapine (REMERON) 45 MG tablet Take by mouth.     sertraline (ZOLOFT) 50 MG tablet Take by mouth.     sildenafil (VIAGRA) 100 MG tablet Take by mouth.     simvastatin (ZOCOR) 80 MG tablet Take 80 mg by mouth at bedtime.     traMADol (ULTRAM) 50 MG tablet Take 1 tablet (50 mg total) by mouth every 6  (six) hours as needed. 15 tablet 0   traZODone (DESYREL) 100 MG tablet Take 100 mg by mouth at bedtime.     No current facility-administered medications for this visit.    REVIEW OF SYSTEMS:  [X]  denotes positive finding, [ ]  denotes negative finding Cardiac  Comments:  Chest pain or chest pressure:    Shortness of breath upon exertion:    Short of breath when lying flat:    Irregular heart rhythm:        Vascular    Pain in calf, thigh, or hip brought on by ambulation: x   Pain in feet at night that wakes you up from your sleep:  x   Blood clot in your veins:    Leg swelling:         Pulmonary    Oxygen at home:    Productive cough:     Wheezing:         Neurologic    Sudden weakness in arms or legs:     Sudden numbness in arms or legs:     Sudden onset of difficulty speaking or slurred speech:    Temporary loss of vision in one eye:     Problems with dizziness:         Gastrointestinal    Blood in stool:     Vomited blood:         Genitourinary    Burning when urinating:     Blood in urine:        Psychiatric    Major depression:         Hematologic    Bleeding problems:    Problems with blood clotting too easily:        Skin    Rashes or ulcers:        Constitutional    Fever or chills:      PHYSICAL EXAM Not performed today.  Office not functional.  PERTINENT LABORATORY AND RADIOLOGIC DATA ABI 08/22/20   CLINICAL DATA:  Claudication   Peripheral arterial disease   EXAM: CT ANGIOGRAPHY OF ABDOMINAL AORTA WITH ILIOFEMORAL RUNOFF   TECHNIQUE: Multidetector CT imaging of the abdomen, pelvis and lower extremities was performed using the standard protocol during bolus administration of intravenous contrast. Multiplanar CT image reconstructions and MIPs were obtained to evaluate the vascular anatomy.   CONTRAST:  90mL OMNIPAQUE IOHEXOL 350 MG/ML SOLN   COMPARISON:  None.   FINDINGS: VASCULAR   Aorta: Distal infrarenal abdominal aorta is  occluded.  No aneurysm.   Celiac: No significant abnormality.  No significant stenosis.   SMA: No significant stenosis.  No signal abnormality.   Renals: Both renal arteries are patent without evidence of aneurysm, dissection, vasculitis, fibromuscular dysplasia or significant stenosis.   IMA: Severe stenosis at the origin due to calcified plaque. Otherwise patent.   RIGHT Lower Extremity   Inflow: Common and external iliac arteries  are occluded. Proximal internal iliac artery is occluded. There is opacification of distal branches consistent with retrograde collateral flow.   Outflow: Flow reconstitutes within a heavily calcified and narrowed common femoral artery. Near complete occlusion at the origin of the profundus femoris and superficial femoral arteries. Both superficial femoral and profunda femoris arteries are densely calcified in their proximal segments. The Profunda femoris artery is patent. The superficial femoral artery is densely calcified with multiple foci of complete to near complete occlusion in its proximal and mid 1/3. The distal 1/3 appears occluded. Right popliteal artery predominately appears occluded. There is a thin opacifying channel just above the tibioperoneal trunk.   Runoff: Tibioperoneal trunk is diffusely narrowed with dense atheromatous plaque. The posterior tibial artery is occluded. The anterior tibial artery is diffusely calcified and narrowed but is likely patent. The peroneal artery is patent.   LEFT Lower Extremity   Inflow: Common and external iliac arteries are occluded. External iliac artery stent is noted. Proximal internal iliac artery is occluded. Opacification of distal internal iliac artery branches consistent with retrograde collateral flow.   Outflow: Flow reconstitutes within a heavily calcified and diffusely narrowed common femoral artery. Near complete occlusion at the origin of the Profunda femoris artery which is patent.  Left superficial femoral artery is densely calcified and predominantly occluded. Flow reconstitutes through prominent muscular collaterals from the Profunda femoris artery at the level of the popliteal artery. The popliteal artery is diffusely calcified and severely narrowed, but remains patent.   Runoff: Severe stenosis of the tibioperoneal trunk due to bulky calcified atheromatous plaque. There is 2 vessel runoff to the left ankle through the posterior tibial and peroneal arteries. The anterior tibial artery is occluded. The peroneal artery is diffusely calcified and narrowed.   Veins: No obvious venous abnormality within the limitations of this arterial phase study.   Review of the MIP images confirms the above findings.   NON-VASCULAR   Lower chest: No acute abnormality.   Hepatobiliary: 1.4 cm subtly enhancing lesion seen in the posterior right hepatic lobe (image 43, series 4). Gallbladder not well evaluated due to collapse configuration.   Pancreas: Unremarkable. No pancreatic ductal dilatation or surrounding inflammatory changes.   Spleen: Normal in size without focal abnormality.   Adrenals/Urinary Tract: Adrenal glands are unremarkable. Subcentimeter right renal hypodensity is too small to fully characterize. Kidneys in ureters otherwise unremarkable. No significant abnormality of the bladder.   Stomach/Bowel: No bowel dilatation to indicate ileus or obstruction.   Lymphatic: No enlarged abdominal or pelvic lymph nodes.   Reproductive: Large right hydrocele.  Prostate is unremarkable.   Other: No abdominal wall hernia or abnormality. No abdominopelvic ascites.   Musculoskeletal: Remote healed fractures noted at the posterior segments of multiple right lower ribs. 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   IMPRESSION: VASCULAR   1. Occluded distal infrarenal abdominal aorta, bilateral common iliac, external  iliac, and proximal internal iliac arteries. Given well-established collateral arteries in the anterior abdominal wall and perirectal region findings are consistent with chronic occlusion. 2. Occluded left superficial femoral artery. Severe stenosis of the left common femoral artery and origin of left Profunda femoris artery. Diffuse severe stenosis of the left popliteal artery and tibioperoneal trunk. Two vessel runoff to the left ankle through the posterior tibial and peroneal arteries. 3. Severely narrowed right common femoral artery. Severe stenosis at the origin of the right profunda femoris artery. Severe long segment stenosis with multiple foci of  near complete occlusion of the right superficial femoral artery in the proximal and mid 1/3. The distal 1/3 of the SFA appears occluded. Right popliteal artery is occluded in its proximal segment with thin patent channel in the distal most segment. Right tibioperoneal trunk is diffusely narrowed. Two vessel runoff to the right ankle through the anterior tibial and peroneal arteries.   NON-VASCULAR   1.4 cm subtly enhancing lesion in the posterior right hepatic lobe (43/4) requires further evaluation with contrast enhanced abdominal MRI.     Electronically Signed   By: Acquanetta Belling M.D.   On: 09/01/2020 09:49  CLINICAL DATA:  Indeterminate liver lesion seen on recent abdomen CTA.   EXAM: MRI ABDOMEN WITHOUT AND WITH CONTRAST   TECHNIQUE: Multiplanar multisequence MR imaging of the abdomen was performed both before and after the administration of intravenous contrast.   CONTRAST:  56mL MULTIHANCE GADOBENATE DIMEGLUMINE 529 MG/ML IV SOLN   COMPARISON:  Abdomen CTA on 08/31/2020   FINDINGS: Lower chest: No acute findings.   Hepatobiliary: A 1.3 cm lesion is seen in the posterior right hepatic lobe (image 52/13), which shows arterial phase hyperenhancement but isointensity on portal venous and delayed phase imaging. This  lesion also shows T2 isointensity and lack of restricted diffusion, consistent with benign focal nodular hyperplasia. No other liver lesions are identified. Gallbladder is unremarkable. No evidence of biliary ductal dilatation.   Pancreas:  No mass or inflammatory changes.   Spleen:  Within normal limits in size and appearance.   Adrenals/Urinary Tract: No masses identified. Tiny right renal cyst noted. No evidence of hydronephrosis.   Stomach/Bowel: Visualized portion unremarkable.   Vascular/Lymphatic: No pathologically enlarged lymph nodes identified. Chronic occlusion of distal abdominal aorta again seen, as better demonstrated on recent CTA.   Other:  None.   Musculoskeletal:  No suspicious bone lesions identified.   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 No evidence of malignancy.     Electronically Signed   By: Danae Orleans M.D.   On: 09/15/2020 20:28     Rande Brunt. Lenell Antu, MD [facility] Phone Number: [phone] 09/19/2020 6:03 PM

## 2020-09-18 NOTE — Progress Notes (Signed)
[facility] OF [place]  ASSESSMENT / PLAN: Adrian Washington is a 70 y.o. male with aortoiliac occlusive disease causing bilateral lower extremity rest pain.  Recommend the following which can slow the progression of atherosclerosis and reduce the risk of major adverse cardiac / limb events:  Complete cessation from all tobacco products. Blood glucose control with goal A1c < 7%. Blood pressure control with goal blood pressure < 140/90 mmHg. Lipid reduction therapy with goal LDL-C <100 mg/dL (<50 if symptomatic from PAD).  Aspirin 81mg  PO QD.  Atorvastatin 40-80mg  PO QD (or other "high intensity" statin therapy).  CT angiogram reveals complex disease including aortoiliac occlusion and severe atherosclerosis of bilateral common femoral arteries. 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 Plan to proceed with aortobifemoral bypass. Will need preop cardiac risk stratification.   CHIEF COMPLAINT: bilateral leg pain  HISTORY OF PRESENT ILLNESS: Adrian Washington is a 70 y.o. male referred to clinic for evaluation of bilateral lower extremity pain.  Patient reports he has significant pain in his calves and thighs when he walks.  He also reports some pain at rest in his feet.  Pain at rest is relieved by dependency.  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  09/04/20: patient returns to discuss CTA findings. Our office is not functional at the moment, so we discussed the CT findings face to face.   09/19/20: returns to discuss MR findings. We reviewed risks / benefits / alternatives.  VASCULAR SURGICAL HISTORY: prior infrainguinal stenting - patient unclear of details  VASCULAR RISK FACTORS: Negative history of cerebrovascular disease / stroke / transient ischemic attack. Negative history of coronary artery disease. Negative history of diabetes mellitus.  Positive history of smoking. Not actively  smoking. Negative history of hypertension.  Negative history of chronic kidney disease. Last GFR > 60 in 11/19/20 system.  Negative history of chronic obstructive pulmonary disease.  Past Medical History:  Diagnosis Date   Hypercholesteremia     Past Surgical History:  Procedure Laterality Date   ELBOW SURGERY Right    SHOULDER SURGERY Left    TRACHEOSTOMY      History reviewed. No pertinent family history.  Social History   Socioeconomic History   Marital status: Single    Spouse name: Not on file   Number of children: Not on file   Years of education: Not on file   Highest education level: Not on file  Occupational History   Not on file  Tobacco Use   Smoking status: Former    Packs/day: 2.00    Types: Cigarettes    Quit date: 07/20/2020    Years since quitting: 0.1   Smokeless tobacco: Never  Substance and Sexual Activity   Alcohol use: Yes   Drug use: Never   Sexual activity: Not on file  Other Topics Concern   Not on file  Social History Narrative   Not on file   Social Determinants of Health   Financial Resource Strain: Not on file  Food Insecurity: Not on file  Transportation Needs: Not on file  Physical Activity: Not on file  Stress: Not on file  Social Connections: Not on file  Intimate Partner Violence: Not on file    No Known Allergies  Current Outpatient Medications  Medication Sig Dispense Refill   atorvastatin (LIPITOR) 40 MG tablet Take by mouth.     Cholecalciferol (VITAMIN D) 2000 units  tablet Take 2,000 Units by mouth daily.     methocarbamol (ROBAXIN) 500 MG tablet Take 1 tablet (500 mg total) by mouth 2 (two) times daily. 20 tablet 0   mirtazapine (REMERON) 45 MG tablet Take by mouth.     sertraline (ZOLOFT) 50 MG tablet Take by mouth.     sildenafil (VIAGRA) 100 MG tablet Take by mouth.     simvastatin (ZOCOR) 80 MG tablet Take 80 mg by mouth at bedtime.     traMADol (ULTRAM) 50 MG tablet Take 1 tablet (50 mg total) by mouth every 6  (six) hours as needed. 15 tablet 0   traZODone (DESYREL) 100 MG tablet Take 100 mg by mouth at bedtime.     No current facility-administered medications for this visit.    REVIEW OF SYSTEMS:  [X] denotes positive finding, [ ] denotes negative finding Cardiac  Comments:  Chest pain or chest pressure:    Shortness of breath upon exertion:    Short of breath when lying flat:    Irregular heart rhythm:        Vascular    Pain in calf, thigh, or hip brought on by ambulation: x   Pain in feet at night that wakes you up from your sleep:  x   Blood clot in your veins:    Leg swelling:         Pulmonary    Oxygen at home:    Productive cough:     Wheezing:         Neurologic    Sudden weakness in arms or legs:     Sudden numbness in arms or legs:     Sudden onset of difficulty speaking or slurred speech:    Temporary loss of vision in one eye:     Problems with dizziness:         Gastrointestinal    Blood in stool:     Vomited blood:         Genitourinary    Burning when urinating:     Blood in urine:        Psychiatric    Major depression:         Hematologic    Bleeding problems:    Problems with blood clotting too easily:        Skin    Rashes or ulcers:        Constitutional    Fever or chills:      PHYSICAL EXAM Not performed today.  Office not functional.  PERTINENT LABORATORY AND RADIOLOGIC DATA ABI 08/22/20   CLINICAL DATA:  Claudication   Peripheral arterial disease   EXAM: CT ANGIOGRAPHY OF ABDOMINAL AORTA WITH ILIOFEMORAL RUNOFF   TECHNIQUE: Multidetector CT imaging of the abdomen, pelvis and lower extremities was performed using the standard protocol during bolus administration of intravenous contrast. Multiplanar CT image reconstructions and MIPs were obtained to evaluate the vascular anatomy.   CONTRAST:  90mL OMNIPAQUE IOHEXOL 350 MG/ML SOLN   COMPARISON:  None.   FINDINGS: VASCULAR   Aorta: Distal infrarenal abdominal aorta is  occluded.  No aneurysm.   Celiac: No significant abnormality.  No significant stenosis.   SMA: No significant stenosis.  No signal abnormality.   Renals: Both renal arteries are patent without evidence of aneurysm, dissection, vasculitis, fibromuscular dysplasia or significant stenosis.   IMA: Severe stenosis at the origin due to calcified plaque. Otherwise patent.   RIGHT Lower Extremity   Inflow: Common and external iliac arteries   are occluded. Proximal internal iliac artery is occluded. There is opacification of distal branches consistent with retrograde collateral flow.   Outflow: Flow reconstitutes within a heavily calcified and narrowed common femoral artery. Near complete occlusion at the origin of the profundus femoris and superficial femoral arteries. Both superficial femoral and profunda femoris arteries are densely calcified in their proximal segments. The Profunda femoris artery is patent. The superficial femoral artery is densely calcified with multiple foci of complete to near complete occlusion in its proximal and mid 1/3. The distal 1/3 appears occluded. Right popliteal artery predominately appears occluded. There is a thin opacifying channel just above the tibioperoneal trunk.   Runoff: Tibioperoneal trunk is diffusely narrowed with dense atheromatous plaque. The posterior tibial artery is occluded. The anterior tibial artery is diffusely calcified and narrowed but is likely patent. The peroneal artery is patent.   LEFT Lower Extremity   Inflow: Common and external iliac arteries are occluded. External iliac artery stent is noted. Proximal internal iliac artery is occluded. Opacification of distal internal iliac artery branches consistent with retrograde collateral flow.   Outflow: Flow reconstitutes within a heavily calcified and diffusely narrowed common femoral artery. Near complete occlusion at the origin of the Profunda femoris artery which is patent.  Left superficial femoral artery is densely calcified and predominantly occluded. Flow reconstitutes through prominent muscular collaterals from the Profunda femoris artery at the level of the popliteal artery. The popliteal artery is diffusely calcified and severely narrowed, but remains patent.   Runoff: Severe stenosis of the tibioperoneal trunk due to bulky calcified atheromatous plaque. There is 2 vessel runoff to the left ankle through the posterior tibial and peroneal arteries. The anterior tibial artery is occluded. The peroneal artery is diffusely calcified and narrowed.   Veins: No obvious venous abnormality within the limitations of this arterial phase study.   Review of the MIP images confirms the above findings.   NON-VASCULAR   Lower chest: No acute abnormality.   Hepatobiliary: 1.4 cm subtly enhancing lesion seen in the posterior right hepatic lobe (image 43, series 4). Gallbladder not well evaluated due to collapse configuration.   Pancreas: Unremarkable. No pancreatic ductal dilatation or surrounding inflammatory changes.   Spleen: Normal in size without focal abnormality.   Adrenals/Urinary Tract: Adrenal glands are unremarkable. Subcentimeter right renal hypodensity is too small to fully characterize. Kidneys in ureters otherwise unremarkable. No significant abnormality of the bladder.   Stomach/Bowel: No bowel dilatation to indicate ileus or obstruction.   Lymphatic: No enlarged abdominal or pelvic lymph nodes.   Reproductive: Large right hydrocele.  Prostate is unremarkable.   Other: No abdominal wall hernia or abnormality. No abdominopelvic ascites.   Musculoskeletal: Remote healed fractures noted at the posterior segments of multiple right lower ribs. 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   IMPRESSION: VASCULAR   1. Occluded distal infrarenal abdominal aorta, bilateral common iliac, external  iliac, and proximal internal iliac arteries. Given well-established collateral arteries in the anterior abdominal wall and perirectal region findings are consistent with chronic occlusion. 2. Occluded left superficial femoral artery. Severe stenosis of the left common femoral artery and origin of left Profunda femoris artery. Diffuse severe stenosis of the left popliteal artery and tibioperoneal trunk. Two vessel runoff to the left ankle through the posterior tibial and peroneal arteries. 3. Severely narrowed right common femoral artery. Severe stenosis at the origin of the right profunda femoris artery. Severe long segment stenosis with multiple foci of  near complete occlusion of the right superficial femoral artery in the proximal and mid 1/3. The distal 1/3 of the SFA appears occluded. Right popliteal artery is occluded in its proximal segment with thin patent channel in the distal most segment. Right tibioperoneal trunk is diffusely narrowed. Two vessel runoff to the right ankle through the anterior tibial and peroneal arteries.   NON-VASCULAR   1.4 cm subtly enhancing lesion in the posterior right hepatic lobe (43/4) requires further evaluation with contrast enhanced abdominal MRI.     Electronically Signed   By: Acquanetta Belling M.D.   On: 09/01/2020 09:49  CLINICAL DATA:  Indeterminate liver lesion seen on recent abdomen CTA.   EXAM: MRI ABDOMEN WITHOUT AND WITH CONTRAST   TECHNIQUE: Multiplanar multisequence MR imaging of the abdomen was performed both before and after the administration of intravenous contrast.   CONTRAST:  56mL MULTIHANCE GADOBENATE DIMEGLUMINE 529 MG/ML IV SOLN   COMPARISON:  Abdomen CTA on 08/31/2020   FINDINGS: Lower chest: No acute findings.   Hepatobiliary: A 1.3 cm lesion is seen in the posterior right hepatic lobe (image 52/13), which shows arterial phase hyperenhancement but isointensity on portal venous and delayed phase imaging. This  lesion also shows T2 isointensity and lack of restricted diffusion, consistent with benign focal nodular hyperplasia. No other liver lesions are identified. Gallbladder is unremarkable. No evidence of biliary ductal dilatation.   Pancreas:  No mass or inflammatory changes.   Spleen:  Within normal limits in size and appearance.   Adrenals/Urinary Tract: No masses identified. Tiny right renal cyst noted. No evidence of hydronephrosis.   Stomach/Bowel: Visualized portion unremarkable.   Vascular/Lymphatic: No pathologically enlarged lymph nodes identified. Chronic occlusion of distal abdominal aorta again seen, as better demonstrated on recent CTA.   Other:  None.   Musculoskeletal:  No suspicious bone lesions identified.   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 No evidence of malignancy.     Electronically Signed   By: Danae Orleans M.D.   On: 09/15/2020 20:28     Rande Brunt. Lenell Antu, MD [facility] Phone Number: [phone] 09/19/2020 6:03 PM

## 2020-09-19 ENCOUNTER — Ambulatory Visit (INDEPENDENT_AMBULATORY_CARE_PROVIDER_SITE_OTHER): Payer: No Typology Code available for payment source | Admitting: Vascular Surgery

## 2020-09-19 ENCOUNTER — Encounter: Payer: Self-pay | Admitting: Vascular Surgery

## 2020-09-19 ENCOUNTER — Other Ambulatory Visit: Payer: Self-pay

## 2020-09-19 VITALS — BP 110/80 | HR 84 | Temp 98.6°F | Resp 20 | Ht 65.0 in | Wt 109.0 lb

## 2020-09-19 DIAGNOSIS — I7409 Other arterial embolism and thrombosis of abdominal aorta: Secondary | ICD-10-CM | POA: Diagnosis not present

## 2020-09-20 ENCOUNTER — Ambulatory Visit (HOSPITAL_COMMUNITY): Payer: No Typology Code available for payment source | Attending: Cardiovascular Disease

## 2020-09-20 ENCOUNTER — Encounter: Payer: Self-pay | Admitting: Cardiovascular Disease

## 2020-09-20 ENCOUNTER — Ambulatory Visit (INDEPENDENT_AMBULATORY_CARE_PROVIDER_SITE_OTHER): Payer: No Typology Code available for payment source | Admitting: Cardiovascular Disease

## 2020-09-20 VITALS — BP 108/80 | HR 90 | Ht 65.0 in | Wt 110.0 lb

## 2020-09-20 DIAGNOSIS — Z01810 Encounter for preprocedural cardiovascular examination: Secondary | ICD-10-CM

## 2020-09-20 DIAGNOSIS — I739 Peripheral vascular disease, unspecified: Secondary | ICD-10-CM | POA: Insufficient documentation

## 2020-09-20 LAB — ECHOCARDIOGRAM COMPLETE
Area-P 1/2: 3.27 cm2
Height: 65 in
S' Lateral: 2.9 cm
Weight: 1760 oz

## 2020-09-20 NOTE — Patient Instructions (Signed)
Medication Instructions:  No changes  Lab Work: none  Testing/Procedures: Your physician has requested that you have an echocardiogram. Echocardiography is a painless test that uses sound waves to create images of your heart. It provides your doctor with information about the size and shape of your heart and how well your heart's chambers and valves are working. This procedure takes approximately one hour. There are no restrictions for this procedure.   Follow-Up: As needed with Dr. Clifton James.
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
## 2020-09-21 ENCOUNTER — Telehealth: Payer: Self-pay | Admitting: *Deleted

## 2020-09-21 DIAGNOSIS — R931 Abnormal findings on diagnostic imaging of heart and coronary circulation: Secondary | ICD-10-CM

## 2020-09-21 DIAGNOSIS — Z01810 Encounter for preprocedural cardiovascular examination: Secondary | ICD-10-CM

## 2020-09-21 NOTE — Telephone Encounter (Signed)
-----   Message from Kathleene Hazel, MD sent at 09/20/2020  1:49 PM EDT ----- Mildly decreased LVEF. Will need a Lexiscan Nuclear stress test to further risk stratify before proceeding with surgery. cdm

## 2020-09-22 ENCOUNTER — Telehealth (HOSPITAL_COMMUNITY): Payer: Self-pay | Admitting: *Deleted

## 2020-09-22 NOTE — Telephone Encounter (Signed)
I spoke w patient and reviewed echo results and recommendation for lexiscan myoview.  Reviewed instructions for this study.  Pt voices understanding.  He has been scheduled for 09/25/20.

## 2020-09-22 NOTE — Telephone Encounter (Signed)
Patient given detailed instructions per Myocardial Perfusion Study Information Sheet for the test on 09/25/20 at 7:30. Patient notified to arrive 15 minutes early and that it is imperative to arrive on time for appointment to keep from having the test rescheduled.  If you need to cancel or reschedule your appointment, please call the office within 24 hours of your appointment. . Patient verbalized understanding.Daneil Dolin

## 2020-09-25 ENCOUNTER — Ambulatory Visit (HOSPITAL_COMMUNITY): Payer: No Typology Code available for payment source | Attending: Internal Medicine

## 2020-09-25 ENCOUNTER — Other Ambulatory Visit: Payer: Self-pay

## 2020-09-25 DIAGNOSIS — Z01810 Encounter for preprocedural cardiovascular examination: Secondary | ICD-10-CM | POA: Diagnosis present

## 2020-09-25 DIAGNOSIS — R931 Abnormal findings on diagnostic imaging of heart and coronary circulation: Secondary | ICD-10-CM

## 2020-09-25 LAB — MYOCARDIAL PERFUSION IMAGING
LV dias vol: 85 mL (ref 62–150)
LV sys vol: 45 mL
Peak HR: 99 {beats}/min
Rest HR: 64 {beats}/min
SDS: 1
SRS: 6
SSS: 7
TID: 1.09

## 2020-09-25 MED ORDER — TECHNETIUM TC 99M TETROFOSMIN IV KIT
30.3000 | PACK | Freq: Once | INTRAVENOUS | Status: AC | PRN
Start: 2020-09-25 — End: 2020-09-25
  Administered 2020-09-25: 30.3 via INTRAVENOUS
  Filled 2020-09-25: qty 31

## 2020-09-25 MED ORDER — TECHNETIUM TC 99M TETROFOSMIN IV KIT
10.2000 | PACK | Freq: Once | INTRAVENOUS | Status: AC | PRN
Start: 2020-09-25 — End: 2020-09-25
  Administered 2020-09-25: 10.2 via INTRAVENOUS
  Filled 2020-09-25: qty 11

## 2020-09-25 MED ORDER — REGADENOSON 0.4 MG/5ML IV SOLN
0.4000 mg | Freq: Once | INTRAVENOUS | Status: AC
  Administered 2020-09-25: 0.4 mg via INTRAVENOUS

## 2020-09-26 ENCOUNTER — Other Ambulatory Visit: Payer: Self-pay

## 2020-09-29 NOTE — Progress Notes (Signed)
Surgical Instructions    Your procedure is scheduled on Friday, August 19th, 2022.   Report to [facility] Main Entrance "A" at 05:30 A.M., then check in with the Admitting office.  Call this number if you have problems the morning of surgery:  [phone]   If you have any questions prior to your surgery date call [phone]: Open Monday-Friday 8am-4pm    Remember:  Do not eat or drink after midnight the night before your surgery    Take these medicines the morning of surgery with A SIP OF WATER:  acetaminophen (TYLENOL) - if needed atorvastatin (LIPITOR)  Follow your surgeon's instructions on when to stop Aspirin.  If no instructions were given by your surgeon then you will need to call the office to get those instructions.     As of today, STOP taking any Aspirin (unless otherwise instructed by your surgeon) Aleve, Naproxen, Ibuprofen, Motrin, Advil, Goody's, BC's, all herbal medications, fish oil, and all vitamins.          Do not wear jewelry  Do not wear lotions, powders, colognes, or deodorant. Men may shave face and neck. Do not bring valuables to the hospital. DO Not wear nail polish, gel polish, artificial nails, or any other type of covering on natural nails including finger and toenails. If patients have artificial nails, gel coating, etc. that need to be removed by a nail salon please have this removed prior to surgery or surgery may need to be canceled/delayed if the surgeon/ anesthesia feels like the patient is unable to be adequately monitored.             [facility] is not responsible for any belongings or valuables.  Do NOT Smoke (Tobacco/Vaping) or drink Alcohol 24 hours prior to your procedure If you use a CPAP at night, you may bring all equipment for your overnight stay.   Contacts, glasses, dentures or bridgework may not be worn into surgery, please bring cases for these belongings   For patients admitted to the hospital, discharge time will be  determined by your treatment team.   Patients discharged the day of surgery will not be allowed to drive home, and someone needs to stay with them for 24 hours.  ONLY 1 SUPPORT PERSON MAY BE PRESENT WHILE YOU ARE IN SURGERY. IF YOU ARE TO BE ADMITTED ONCE YOU ARE IN YOUR ROOM YOU WILL BE ALLOWED TWO (2) VISITORS.  Minor children may have two parents present. Special consideration for safety and communication needs will be reviewed on a case by case basis.  Special instructions:    Oral Hygiene is also important to reduce your risk of infection.  Remember - BRUSH YOUR TEETH THE MORNING OF SURGERY WITH YOUR REGULAR TOOTHPASTE   [facility]- Preparing For Surgery  Before surgery, you can play an important role. Because skin is not sterile, your skin needs to be as free of germs as possible. You can reduce the number of germs on your skin by washing with CHG (chlorahexidine gluconate) Soap before surgery.  CHG is an antiseptic cleaner which kills germs and bonds with the skin to continue killing germs even after washing.     Please do not use if you have an allergy to CHG or antibacterial soaps. If your skin becomes reddened/irritated stop using the CHG.  Do not shave (including legs and underarms) for at least 48 hours prior to first CHG shower. It is OK to shave your face.  Please follow these instructions carefully.  Shower the NIGHT BEFORE SURGERY and the MORNING OF SURGERY with CHG Soap.   If you chose to wash your hair, wash your hair first as usual with your normal shampoo. After you shampoo, rinse your hair and body thoroughly to remove the shampoo.  Then [employer] and genitals (private parts) with your normal soap and rinse thoroughly to remove soap.  After that Use CHG Soap as you would any other liquid soap. You can apply CHG directly to the skin and wash gently with a scrungie or a clean washcloth.   Apply the CHG Soap to your body ONLY FROM THE NECK DOWN.  Do not use on open  wounds or open sores. Avoid contact with your eyes, ears, mouth and genitals (private parts). Wash Face and genitals (private parts)  with your normal soap.   Wash thoroughly, paying special attention to the area where your surgery will be performed.  Thoroughly rinse your body with warm water from the neck down.  DO NOT shower/wash with your normal soap after using and rinsing off the CHG Soap.  Pat yourself dry with a CLEAN TOWEL.  Wear CLEAN PAJAMAS to bed the night before surgery  Place CLEAN SHEETS on your bed the night before your surgery  DO NOT SLEEP WITH PETS.   Day of Surgery:  Take a shower with CHG soap. Wear Clean/Comfortable clothing the morning of surgery Do not apply any deodorants/lotions.   Remember to brush your teeth WITH YOUR REGULAR TOOTHPASTE.   Please read over the following fact sheets that you were given.

## 2020-10-02 ENCOUNTER — Encounter (HOSPITAL_COMMUNITY)
Admission: RE | Admit: 2020-10-02 | Discharge: 2020-10-02 | Disposition: A | Discharge status: Home / Self Care | Payer: No Typology Code available for payment source | Source: Ambulatory Visit | Attending: Vascular Surgery | Admitting: Vascular Surgery

## 2020-10-02 ENCOUNTER — Encounter (HOSPITAL_COMMUNITY): Payer: Self-pay

## 2020-10-02 DIAGNOSIS — Z7901 Long term (current) use of anticoagulants: Secondary | ICD-10-CM | POA: Insufficient documentation

## 2020-10-02 DIAGNOSIS — Z20822 Contact with and (suspected) exposure to covid-19: Secondary | ICD-10-CM | POA: Diagnosis not present

## 2020-10-02 DIAGNOSIS — I252 Old myocardial infarction: Secondary | ICD-10-CM | POA: Diagnosis not present

## 2020-10-02 DIAGNOSIS — Z01812 Encounter for preprocedural laboratory examination: Secondary | ICD-10-CM | POA: Insufficient documentation

## 2020-10-02 HISTORY — DX: Other psychoactive substance abuse, uncomplicated: F19.10

## 2020-10-02 HISTORY — DX: Depression, unspecified: F32.A

## 2020-10-02 LAB — COMPREHENSIVE METABOLIC PANEL
ALT: 18 U/L (ref 0–44)
AST: 19 U/L (ref 15–41)
Albumin: 3.6 g/dL (ref 3.5–5.0)
Alkaline Phosphatase: 96 U/L (ref 38–126)
Anion gap: 10 (ref 5–15)
BUN: 12 mg/dL (ref 8–23)
CO2: 24 mmol/L (ref 22–32)
Calcium: 9.5 mg/dL (ref 8.9–10.3)
Chloride: 103 mmol/L (ref 98–111)
Creatinine, Ser: 0.79 mg/dL (ref 0.61–1.24)
GFR, Estimated: >60 mL/min (ref >60)
Glucose, Bld: 101 mg/dL — ABNORMAL HIGH (ref 70–99)
Potassium: 3.9 mmol/L (ref 3.5–5.1)
Sodium: 137 mmol/L (ref 135–145)
Total Bilirubin: 0.4 mg/dL (ref 0.3–1.2)
Total Protein: 7.3 g/dL (ref 6.5–8.1)

## 2020-10-02 LAB — URINALYSIS, ROUTINE W REFLEX MICROSCOPIC
Bacteria, UA: NONE SEEN
Bilirubin Urine: NEGATIVE
Glucose, UA: NEGATIVE mg/dL
Ketones, ur: NEGATIVE mg/dL
Leukocytes,Ua: NEGATIVE
Nitrite: NEGATIVE
Protein, ur: NEGATIVE mg/dL
Specific Gravity, Urine: 1.02 (ref 1.005–1.030)
pH: 5 (ref 5.0–8.0)

## 2020-10-02 LAB — BLOOD GAS, ARTERIAL
Acid-Base Excess: 2.3 mmol/L — ABNORMAL HIGH (ref 0.0–2.0)
Bicarbonate: 26 mmol/L (ref 20.0–28.0)
FIO2: 21
O2 Saturation: 95.6 %
Patient temperature: 37
pCO2 arterial: 38.1 mmHg (ref 32.0–48.0)
pH, Arterial: 7.449 (ref 7.350–7.450)
pO2, Arterial: 78.4 mmHg — ABNORMAL LOW (ref 83.0–108.0)

## 2020-10-02 LAB — SURGICAL PCR SCREEN
MRSA, PCR: NEGATIVE
Staphylococcus aureus: NEGATIVE

## 2020-10-02 LAB — CBC
HCT: 37.6 % — ABNORMAL LOW (ref 39.0–52.0)
Hemoglobin: 12.6 g/dL — ABNORMAL LOW (ref 13.0–17.0)
MCH: 31.6 pg (ref 26.0–34.0)
MCHC: 33.5 g/dL (ref 30.0–36.0)
MCV: 94.2 fL (ref 80.0–100.0)
Platelets: 265 10*3/uL (ref 150–400)
RBC: 3.99 MIL/uL — ABNORMAL LOW (ref 4.22–5.81)
RDW: 15.1 % (ref 11.5–15.5)
WBC: 6.2 10*3/uL (ref 4.0–10.5)
nRBC: 0 % (ref 0.0–0.2)

## 2020-10-02 LAB — SARS CORONAVIRUS 2 (TAT 6-24 HRS): SARS Coronavirus 2: NEGATIVE

## 2020-10-02 LAB — PROTIME-INR
INR: 1 (ref 0.8–1.2)
Prothrombin Time: 13.1 seconds (ref 11.4–15.2)

## 2020-10-02 LAB — APTT: aPTT: 31 seconds (ref 24–36)

## 2020-10-02 NOTE — Progress Notes (Signed)
The patient has been notified of the result and verbalized understanding.  All questions (if any) were answered. Lendon Ka, RN 10/02/2020 5:57 PM

## 2020-10-02 NOTE — Progress Notes (Addendum)
PCP - [name] has an appointment at the [facility] in AM, he is not sure what type of Dr he will be seeing. I had patient sign a release of information, will fax a request for offive visit notes and any labs or diagnostic studies.  Cardiologist - Dr. Clifton James- saw on 09/20/20 for cardiac clearance, 1st time patient saw Dr. Matthew Folks.  Chest x-ray - na  EKG - 8/3/2  Stress Test - no  ECHO - 09/20/20  Cardiac Cath - 09/25/20  AICD-no PM-no LOOP-no  Sleep Study no CPAP - no  LABS-CBC, CMP, PT, PTT, ABG, T/S, UA, PCR, Covid.  ASA-81- continue  ERAS-no  HA1C-na Fasting Blood Sugar - na Checks Blood Sugar _0____ times a day  Anesthesia-  Pt denies having chest pain, sob, or fever at this time. All instructions explained to the pt, with a verbal understanding of the material. Pt agrees to go over the instructions while at home for a better understanding. Pt also instructed to self quarantine after being tested for COVID-19. The opportunity to ask questions was provided.

## 2020-10-04 NOTE — Progress Notes (Signed)
Anesthesia Chart Review:  Seen by cardiologist Dr. Clifton Azlaan Isidore on 09/20/2020 for preop evaluation prior to undergoing aortobifem.  Per note, " He has no signs of angina, heart failure or arrhythmias. Feeling well other than leg pain. EKG is normal. BP is normal. Will arrange an echo to assess his LV function. If the echo is ok, can proceed with his surgery without further workup.  Addendum 09/25/20: Echo with LVEF=40-45%, moderate LVH. No significant valve disease.  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  OK to proceed with surgery."  Preop labs reviewed, unremarkable.  EKG 09/20/2020: Sinus rhythm.  Rate 90.  Nuclear stress 09/25/2020: The left ventricular ejection fraction is mildly decreased (45-54%). Nuclear stress EF: 47%. There was no ST segment deviation noted during stress. This is an intermediate risk study. Findings consistent with prior myocardial infarction.   There is a small, fixed severe perfusion defect present in the basal anterior and mid anterior location. LVEF reduced with basal and mid anterior akinesis. Findings consistent with prior infarct. No definite ischemia on perfusion images.    Overall intermediate risk study due to reduced EF and prior infarct.   TTE 09/20/2020:  1. Mid/Distal anterior wall apical and septal hypokinesis . Left  ventricular ejection fraction, by estimation, is 40 to 45%. The left  ventricle has mildly decreased function. The left ventricle demonstrates  regional wall motion abnormalities (see  scoring diagram/findings for description). The left ventricular internal  cavity size was mildly dilated. There is moderate asymmetric left  ventricular hypertrophy of the basal and septal segments. Left ventricular  diastolic parameters were normal.   2. Right ventricular systolic function is normal. The right ventricular  size is normal.   3. The mitral valve is normal in structure. No evidence of mitral valve   regurgitation. No evidence of mitral stenosis.   4. The aortic valve is tricuspid. Aortic valve regurgitation is not  visualized. Mild aortic valve sclerosis is present, with no evidence of  aortic valve stenosis.   5. Aortic dilatation noted. There is mild dilatation of the aortic root,  measuring 40 mm.   6. The inferior vena cava is normal in size with greater than 50%  respiratory variability, suggesting right atrial pressure of 3 mmHg.     Zannie Cove [facility] Short Stay Center/Anesthesiology Phone [phone] 10/04/2020 10:33 AM

## 2020-10-04 NOTE — Anesthesia Preprocedure Evaluation (Addendum)
Anesthesia Evaluation  Patient identified by MRN, date of birth, ID band Patient awake    Reviewed: Allergy & Precautions, NPO status , Patient's Chart, lab work & pertinent test results  History of Anesthesia Complications Negative for: history of anesthetic complications  Airway Mallampati: II  TM Distance: >3 FB Neck ROM: Full    Dental  (+) Dental Advisory Given, Upper Dentures   Pulmonary former smoker,    Pulmonary exam normal        Cardiovascular + CAD, + Peripheral Vascular Disease and +CHF  Normal cardiovascular exam   '22 Myoperfusion - The left ventricular ejection fraction is mildly decreased (45-54%). Nuclear stress EF: 47%. There was no ST segment deviation noted during stress. This is an intermediate risk study. Findings consistent with prior myocardial infarction. There is a small, fixed severe perfusion defect present in the basal anterior and mid anterior location. LVEF reduced with basal and mid anterior akinesis. Findings consistent with prior infarct. No definite ischemia on perfusion images.  Overall intermediate risk study due to reduced EF and prior infarct.   '22 TTE - EF 40 to 45%. The left ventricular internal cavity size was mildly dilated. There is moderate asymmetric left ventricular hypertrophy of the basal and septal segments. Mild aortic valve sclerosis is present, with no evidence of aortic valve stenosis. There is mild dilatation of the aortic root, measuring 40 mm.     Neuro/Psych PSYCHIATRIC DISORDERS Depression negative neurological ROS     GI/Hepatic negative GI ROS, (+)     substance abuse  ,   Endo/Other  negative endocrine ROS  Renal/GU negative Renal ROS     Musculoskeletal negative musculoskeletal ROS (+)   Abdominal   Peds  Hematology  (+) anemia ,   Anesthesia Other Findings Covid test negative   Reproductive/Obstetrics                            Anesthesia Physical Anesthesia Plan  ASA: 3  Anesthesia Plan: General   Post-op Pain Management:    Induction: Intravenous  PONV Risk Score and Plan: 2 and Treatment may vary due to age or medical condition, Ondansetron and Dexamethasone  Airway Management Planned: Oral ETT  Additional Equipment: Arterial line  Intra-op Plan:   Post-operative Plan: Possible Post-op intubation/ventilation  Informed Consent: I have reviewed the patients History and Physical, chart, labs and discussed the procedure including the risks, benefits and alternatives for the proposed anesthesia with the patient or authorized representative who has indicated his/her understanding and acceptance.     Dental advisory given  Plan Discussed with: CRNA and Anesthesiologist  Anesthesia Plan Comments: ( )      Anesthesia Quick Evaluation

## 2020-10-06 ENCOUNTER — Encounter (HOSPITAL_COMMUNITY): Payer: Self-pay | Admitting: Vascular Surgery

## 2020-10-06 ENCOUNTER — Inpatient Hospital Stay (HOSPITAL_COMMUNITY): Payer: No Typology Code available for payment source | Admitting: Physician Assistant

## 2020-10-06 ENCOUNTER — Inpatient Hospital Stay (HOSPITAL_COMMUNITY): Payer: No Typology Code available for payment source | Admitting: Certified Registered Nurse Anesthetist

## 2020-10-06 ENCOUNTER — Inpatient Hospital Stay (HOSPITAL_COMMUNITY): Payer: No Typology Code available for payment source

## 2020-10-06 ENCOUNTER — Encounter (HOSPITAL_COMMUNITY)
Admission: RE | Disposition: A | Discharge status: Home / Self Care | Payer: Self-pay | Source: Home / Self Care | Attending: Vascular Surgery

## 2020-10-06 ENCOUNTER — Inpatient Hospital Stay (HOSPITAL_COMMUNITY)
Admission: RE | Admit: 2020-10-06 | Discharge: 2020-10-11 | DRG: 272 | Disposition: A | Discharge status: Home / Self Care | Payer: No Typology Code available for payment source | Attending: Vascular Surgery | Admitting: Vascular Surgery

## 2020-10-06 ENCOUNTER — Other Ambulatory Visit: Payer: Self-pay

## 2020-10-06 DIAGNOSIS — Z87891 Personal history of nicotine dependence: Secondary | ICD-10-CM | POA: Diagnosis not present

## 2020-10-06 DIAGNOSIS — I70229 Atherosclerosis of native arteries of extremities with rest pain, unspecified extremity: Secondary | ICD-10-CM | POA: Diagnosis not present

## 2020-10-06 DIAGNOSIS — I2510 Atherosclerotic heart disease of native coronary artery without angina pectoris: Secondary | ICD-10-CM | POA: Diagnosis present

## 2020-10-06 DIAGNOSIS — I70223 Atherosclerosis of native arteries of extremities with rest pain, bilateral legs: Secondary | ICD-10-CM | POA: Diagnosis present

## 2020-10-06 DIAGNOSIS — F32A Depression, unspecified: Secondary | ICD-10-CM | POA: Diagnosis present

## 2020-10-06 DIAGNOSIS — Z20822 Contact with and (suspected) exposure to covid-19: Secondary | ICD-10-CM | POA: Diagnosis present

## 2020-10-06 DIAGNOSIS — E876 Hypokalemia: Secondary | ICD-10-CM | POA: Diagnosis not present

## 2020-10-06 DIAGNOSIS — Z79899 Other long term (current) drug therapy: Secondary | ICD-10-CM

## 2020-10-06 DIAGNOSIS — Q251 Coarctation of aorta: Secondary | ICD-10-CM

## 2020-10-06 DIAGNOSIS — E7800 Pure hypercholesterolemia, unspecified: Secondary | ICD-10-CM | POA: Diagnosis present

## 2020-10-06 DIAGNOSIS — I350 Nonrheumatic aortic (valve) stenosis: Secondary | ICD-10-CM | POA: Diagnosis present

## 2020-10-06 DIAGNOSIS — I7409 Other arterial embolism and thrombosis of abdominal aorta: Principal | ICD-10-CM | POA: Diagnosis present

## 2020-10-06 HISTORY — PX: AORTA - BILATERAL FEMORAL ARTERY BYPASS GRAFT: SHX1175

## 2020-10-06 LAB — POCT I-STAT 7, (LYTES, BLD GAS, ICA,H+H)
Acid-Base Excess: 4 mmol/L — ABNORMAL HIGH (ref 0.0–2.0)
Acid-Base Excess: 1 mmol/L (ref 0.0–2.0)
Acid-base deficit: 1 mmol/L (ref 0.0–2.0)
Bicarbonate: 28.3 mmol/L — ABNORMAL HIGH (ref 20.0–28.0)
Bicarbonate: 25.2 mmol/L (ref 20.0–28.0)
Bicarbonate: 24.3 mmol/L (ref 20.0–28.0)
Calcium, Ion: 1.2 mmol/L (ref 1.15–1.40)
Calcium, Ion: 1.15 mmol/L (ref 1.15–1.40)
Calcium, Ion: 1.12 mmol/L — ABNORMAL LOW (ref 1.15–1.40)
HCT: 31 % — ABNORMAL LOW (ref 39.0–52.0)
HCT: 30 % — ABNORMAL LOW (ref 39.0–52.0)
HCT: 27 % — ABNORMAL LOW (ref 39.0–52.0)
Hemoglobin: 10.5 g/dL — ABNORMAL LOW (ref 13.0–17.0)
Hemoglobin: 10.2 g/dL — ABNORMAL LOW (ref 13.0–17.0)
Hemoglobin: 9.2 g/dL — ABNORMAL LOW (ref 13.0–17.0)
O2 Saturation: 100 %
O2 Saturation: 100 %
O2 Saturation: 99 %
Potassium: 3.6 mmol/L (ref 3.5–5.1)
Potassium: 3.2 mmol/L — ABNORMAL LOW (ref 3.5–5.1)
Potassium: 3.1 mmol/L — ABNORMAL LOW (ref 3.5–5.1)
Sodium: 140 mmol/L (ref 135–145)
Sodium: 140 mmol/L (ref 135–145)
Sodium: 140 mmol/L (ref 135–145)
TCO2: 30 mmol/L (ref 22–32)
TCO2: 26 mmol/L (ref 22–32)
TCO2: 25 mmol/L (ref 22–32)
pCO2 arterial: 42.3 mmHg (ref 32.0–48.0)
pCO2 arterial: 38.7 mmHg (ref 32.0–48.0)
pCO2 arterial: 39.9 mmHg (ref 32.0–48.0)
pH, Arterial: 7.433 (ref 7.350–7.450)
pH, Arterial: 7.422 (ref 7.350–7.450)
pH, Arterial: 7.392 (ref 7.350–7.450)
pO2, Arterial: 260 mmHg — ABNORMAL HIGH (ref 83.0–108.0)
pO2, Arterial: 173 mmHg — ABNORMAL HIGH (ref 83.0–108.0)
pO2, Arterial: 159 mmHg — ABNORMAL HIGH (ref 83.0–108.0)

## 2020-10-06 LAB — BASIC METABOLIC PANEL
Anion gap: 8 (ref 5–15)
BUN: 7 mg/dL — ABNORMAL LOW (ref 8–23)
CO2: 26 mmol/L (ref 22–32)
Calcium: 8.1 mg/dL — ABNORMAL LOW (ref 8.9–10.3)
Chloride: 104 mmol/L (ref 98–111)
Creatinine, Ser: 0.68 mg/dL (ref 0.61–1.24)
GFR, Estimated: >60 mL/min (ref >60)
Glucose, Bld: 192 mg/dL — ABNORMAL HIGH (ref 70–99)
Potassium: 3.4 mmol/L — ABNORMAL LOW (ref 3.5–5.1)
Sodium: 138 mmol/L (ref 135–145)

## 2020-10-06 LAB — BLOOD GAS, ARTERIAL
Acid-base deficit: 1.5 mmol/L (ref 0.0–2.0)
Bicarbonate: 23.5 mmol/L (ref 20.0–28.0)
Drawn by: 134031
FIO2: 100
O2 Saturation: 99.4 %
Patient temperature: 36.5
pCO2 arterial: 44.5 mmHg (ref 32.0–48.0)
pH, Arterial: 7.34 — ABNORMAL LOW (ref 7.350–7.450)
pO2, Arterial: 186 mmHg — ABNORMAL HIGH (ref 83.0–108.0)

## 2020-10-06 LAB — MAGNESIUM: Magnesium: 1.3 mg/dL — ABNORMAL LOW (ref 1.7–2.4)

## 2020-10-06 LAB — CBC
HCT: 32.9 % — ABNORMAL LOW (ref 39.0–52.0)
Hemoglobin: 10.6 g/dL — ABNORMAL LOW (ref 13.0–17.0)
MCH: 31.6 pg (ref 26.0–34.0)
MCHC: 32.2 g/dL (ref 30.0–36.0)
MCV: 98.2 fL (ref 80.0–100.0)
Platelets: 194 10*3/uL (ref 150–400)
RBC: 3.35 MIL/uL — ABNORMAL LOW (ref 4.22–5.81)
RDW: 15.3 % (ref 11.5–15.5)
WBC: 10.7 10*3/uL — ABNORMAL HIGH (ref 4.0–10.5)
nRBC: 0 % (ref 0.0–0.2)

## 2020-10-06 LAB — PROTIME-INR
INR: 1.1 (ref 0.8–1.2)
Prothrombin Time: 14.7 seconds (ref 11.4–15.2)

## 2020-10-06 LAB — ABO/RH: ABO/RH(D): A POS

## 2020-10-06 LAB — POCT ACTIVATED CLOTTING TIME
Activated Clotting Time: 149 seconds
Activated Clotting Time: 254 seconds
Activated Clotting Time: 208 seconds

## 2020-10-06 LAB — PREPARE RBC (CROSSMATCH)

## 2020-10-06 LAB — APTT: aPTT: 26 seconds (ref 24–36)

## 2020-10-06 SURGERY — CREATION, BYPASS, ARTERIAL, AORTA TO FEMORAL, BILATERAL, USING GRAFT
Anesthesia: General | Site: Abdomen | Laterality: Bilateral

## 2020-10-06 MED ORDER — PHENYLEPHRINE HCL-NACL 20-0.9 MG/250ML-% IV SOLN
INTRAVENOUS | Status: DC | PRN
  Administered 2020-10-06: 25 ug/min via INTRAVENOUS

## 2020-10-06 MED ORDER — ALUM & MAG HYDROXIDE-SIMETH 200-200-20 MG/5ML PO SUSP
15.0000 mL | ORAL | Status: DC | PRN
  Filled 2020-10-06: qty 30

## 2020-10-06 MED ORDER — PROPOFOL 10 MG/ML IV BOLUS
INTRAVENOUS | Status: AC
  Filled 2020-10-06: qty 40

## 2020-10-06 MED ORDER — ORAL CARE MOUTH RINSE: 15.0000 mL | Freq: Once | OROMUCOSAL | Status: AC

## 2020-10-06 MED ORDER — DOCUSATE SODIUM 100 MG PO CAPS
100.0000 mg | ORAL_CAPSULE | Freq: Every day | ORAL | Status: DC
  Administered 2020-10-08: 100 mg via ORAL
  Filled 2020-10-06 (×2): qty 1

## 2020-10-06 MED ORDER — ONDANSETRON HCL 4 MG/2ML IJ SOLN: 4.0000 mg | Freq: Once | INTRAMUSCULAR | Status: DC | PRN

## 2020-10-06 MED ORDER — MIDAZOLAM HCL 2 MG/2ML IJ SOLN
INTRAMUSCULAR | Status: AC
  Filled 2020-10-06: qty 2

## 2020-10-06 MED ORDER — LIDOCAINE 2% (20 MG/ML) 5 ML SYRINGE
INTRAMUSCULAR | Status: AC
  Filled 2020-10-06: qty 5

## 2020-10-06 MED ORDER — FENTANYL CITRATE (PF) 100 MCG/2ML IJ SOLN
INTRAMUSCULAR | Status: AC
  Filled 2020-10-06: qty 2

## 2020-10-06 MED ORDER — CHLORHEXIDINE GLUCONATE CLOTH 2 % EX PADS: 6.0000 | MEDICATED_PAD | Freq: Once | CUTANEOUS | Status: DC

## 2020-10-06 MED ORDER — HEPARIN 6000 UNIT IRRIGATION SOLUTION
Status: AC
  Filled 2020-10-06: qty 500

## 2020-10-06 MED ORDER — EPHEDRINE SULFATE 50 MG/ML IJ SOLN
INTRAMUSCULAR | Status: DC | PRN
  Administered 2020-10-06 (×2): 5 mg via INTRAVENOUS

## 2020-10-06 MED ORDER — CEFAZOLIN SODIUM-DEXTROSE 2-4 GM/100ML-% IV SOLN
2.0000 g | Freq: Three times a day (TID) | INTRAVENOUS | Status: AC
Start: 2020-10-06 — End: 2020-10-07
  Administered 2020-10-06 – 2020-10-07 (×2): 2 g via INTRAVENOUS
  Filled 2020-10-06 (×3): qty 100

## 2020-10-06 MED ORDER — VASOPRESSIN 20 UNIT/ML IV SOLN
INTRAVENOUS | Status: AC
  Filled 2020-10-06: qty 1

## 2020-10-06 MED ORDER — LACTATED RINGERS IV SOLN: INTRAVENOUS | Status: DC | PRN

## 2020-10-06 MED ORDER — MAGNESIUM SULFATE 2 GM/50ML IV SOLN: 2.0000 g | Freq: Every day | INTRAVENOUS | Status: DC | PRN

## 2020-10-06 MED ORDER — ACETAMINOPHEN 325 MG PO TABS
325.0000 mg | ORAL_TABLET | ORAL | Status: DC | PRN
Start: 2020-10-06 — End: 2020-10-11
  Administered 2020-10-09: 650 mg via ORAL
  Filled 2020-10-06 (×3): qty 2

## 2020-10-06 MED ORDER — OXYMETAZOLINE HCL 0.05 % NA SOLN
NASAL | Status: DC | PRN
  Administered 2020-10-06: 2 via NASAL

## 2020-10-06 MED ORDER — LABETALOL HCL 5 MG/ML IV SOLN
INTRAVENOUS | Status: DC | PRN
  Administered 2020-10-06 (×2): 5 mg via INTRAVENOUS

## 2020-10-06 MED ORDER — ROCURONIUM BROMIDE 10 MG/ML (PF) SYRINGE
PREFILLED_SYRINGE | INTRAVENOUS | Status: AC
  Filled 2020-10-06: qty 20

## 2020-10-06 MED ORDER — SODIUM CHLORIDE 0.9 % IV SOLN
500.0000 mL | Freq: Once | INTRAVENOUS | Status: DC | PRN
Start: 2020-10-06 — End: 2020-10-11

## 2020-10-06 MED ORDER — ONDANSETRON HCL 4 MG/2ML IJ SOLN
INTRAMUSCULAR | Status: DC | PRN
  Administered 2020-10-06: 4 mg via INTRAVENOUS

## 2020-10-06 MED ORDER — DEXAMETHASONE SODIUM PHOSPHATE 10 MG/ML IJ SOLN
INTRAMUSCULAR | Status: DC | PRN
  Administered 2020-10-06: 5 mg via INTRAVENOUS

## 2020-10-06 MED ORDER — GUAIFENESIN-DM 100-10 MG/5ML PO SYRP: 15.0000 mL | ORAL_SOLUTION | ORAL | Status: DC | PRN

## 2020-10-06 MED ORDER — EPHEDRINE 5 MG/ML INJ
INTRAVENOUS | Status: AC
  Filled 2020-10-06: qty 5

## 2020-10-06 MED ORDER — METOPROLOL TARTRATE 5 MG/5ML IV SOLN: 2.0000 mg | INTRAVENOUS | Status: DC | PRN

## 2020-10-06 MED ORDER — OXYCODONE HCL 5 MG PO TABS: 5.0000 mg | ORAL_TABLET | Freq: Once | ORAL | Status: DC | PRN

## 2020-10-06 MED ORDER — LABETALOL HCL 5 MG/ML IV SOLN
10.0000 mg | INTRAVENOUS | Status: DC | PRN
  Filled 2020-10-06: qty 4

## 2020-10-06 MED ORDER — CEFAZOLIN SODIUM-DEXTROSE 2-4 GM/100ML-% IV SOLN
2.0000 g | INTRAVENOUS | Status: AC
  Administered 2020-10-06 (×2): 2 g via INTRAVENOUS
  Filled 2020-10-06: qty 100

## 2020-10-06 MED ORDER — LACTATED RINGERS IV SOLN: INTRAVENOUS | Status: DC

## 2020-10-06 MED ORDER — LACTATED RINGERS IV SOLN
INTRAVENOUS | Status: DC
Start: 2020-10-06 — End: 2020-10-09

## 2020-10-06 MED ORDER — NITROGLYCERIN IN D5W 200-5 MCG/ML-% IV SOLN
INTRAVENOUS | Status: DC | PRN
  Administered 2020-10-06: 10 ug/min via INTRAVENOUS

## 2020-10-06 MED ORDER — POTASSIUM CHLORIDE CRYS ER 20 MEQ PO TBCR: 20.0000 meq | EXTENDED_RELEASE_TABLET | Freq: Every day | ORAL | Status: DC | PRN

## 2020-10-06 MED ORDER — HEPARIN SODIUM (PORCINE) 1000 UNIT/ML IJ SOLN
INTRAMUSCULAR | Status: DC | PRN
  Administered 2020-10-06 (×2): 5000 [IU] via INTRAVENOUS
  Administered 2020-10-06 (×2): 3000 [IU] via INTRAVENOUS

## 2020-10-06 MED ORDER — CHLORHEXIDINE GLUCONATE CLOTH 2 % EX PADS
6.0000 | MEDICATED_PAD | Freq: Every day | CUTANEOUS | Status: DC
  Administered 2020-10-06 – 2020-10-08 (×3): 6 via TOPICAL

## 2020-10-06 MED ORDER — SODIUM CHLORIDE 0.9% IV SOLUTION: Freq: Once | INTRAVENOUS | Status: DC

## 2020-10-06 MED ORDER — HYDRALAZINE HCL 20 MG/ML IJ SOLN
5.0000 mg | INTRAMUSCULAR | Status: DC | PRN
Start: 2020-10-06 — End: 2020-10-11

## 2020-10-06 MED ORDER — POTASSIUM CHLORIDE 10 MEQ/100ML IV SOLN
10.0000 meq | INTRAVENOUS | Status: AC
Start: 2020-10-06 — End: 2020-10-06
  Administered 2020-10-06 (×2): 10 meq via INTRAVENOUS
  Filled 2020-10-06 (×2): qty 100

## 2020-10-06 MED ORDER — ONDANSETRON HCL 4 MG/2ML IJ SOLN: 4.0000 mg | Freq: Four times a day (QID) | INTRAMUSCULAR | Status: DC | PRN

## 2020-10-06 MED ORDER — FENTANYL CITRATE (PF) 250 MCG/5ML IJ SOLN
INTRAMUSCULAR | Status: AC
  Filled 2020-10-06: qty 5

## 2020-10-06 MED ORDER — MORPHINE SULFATE (PF) 2 MG/ML IV SOLN
2.0000 mg | INTRAVENOUS | Status: DC | PRN
  Administered 2020-10-06: 2 mg via INTRAVENOUS
  Administered 2020-10-06 (×2): 4 mg via INTRAVENOUS
  Administered 2020-10-07: 2 mg via INTRAVENOUS
  Administered 2020-10-07: 4 mg via INTRAVENOUS
  Administered 2020-10-07: 2 mg via INTRAVENOUS
  Administered 2020-10-07 (×5): 4 mg via INTRAVENOUS
  Administered 2020-10-07: 2 mg via INTRAVENOUS
  Administered 2020-10-08 (×5): 4 mg via INTRAVENOUS
  Filled 2020-10-06: qty 2
  Filled 2020-10-06: qty 1
  Filled 2020-10-06: qty 2
  Filled 2020-10-06: qty 1
  Filled 2020-10-06 (×7): qty 2
  Filled 2020-10-06: qty 1
  Filled 2020-10-06 (×6): qty 2
  Filled 2020-10-06: qty 1

## 2020-10-06 MED ORDER — FENTANYL CITRATE (PF) 100 MCG/2ML IJ SOLN
25.0000 ug | INTRAMUSCULAR | Status: DC | PRN
  Administered 2020-10-06: 25 ug via INTRAVENOUS

## 2020-10-06 MED ORDER — HEPARIN SODIUM (PORCINE) 1000 UNIT/ML IJ SOLN
INTRAMUSCULAR | Status: AC
  Filled 2020-10-06: qty 2

## 2020-10-06 MED ORDER — HEPARIN 6000 UNIT IRRIGATION SOLUTION
Status: DC | PRN
  Administered 2020-10-06: 1

## 2020-10-06 MED ORDER — ALBUMIN HUMAN 5 % IV SOLN
INTRAVENOUS | Status: DC | PRN
Start: 2020-10-06 — End: 2020-10-06

## 2020-10-06 MED ORDER — ACETAMINOPHEN 325 MG RE SUPP
325.0000 mg | RECTAL | Status: DC | PRN
  Filled 2020-10-06: qty 2

## 2020-10-06 MED ORDER — FLEET ENEMA 7-19 GM/118ML RE ENEM
1.0000 | ENEMA | Freq: Once | RECTAL | Status: DC | PRN
  Filled 2020-10-06: qty 1

## 2020-10-06 MED ORDER — ROCURONIUM BROMIDE 10 MG/ML (PF) SYRINGE
PREFILLED_SYRINGE | INTRAVENOUS | Status: DC | PRN
  Administered 2020-10-06: 20 mg via INTRAVENOUS
  Administered 2020-10-06: 60 mg via INTRAVENOUS
  Administered 2020-10-06 (×4): 20 mg via INTRAVENOUS

## 2020-10-06 MED ORDER — SUGAMMADEX SODIUM 200 MG/2ML IV SOLN
INTRAVENOUS | Status: DC | PRN
  Administered 2020-10-06 (×2): 100 mg via INTRAVENOUS

## 2020-10-06 MED ORDER — MAGNESIUM SULFATE 4 GM/100ML IV SOLN
4.0000 g | Freq: Once | INTRAVENOUS | Status: AC
  Administered 2020-10-06: 4 g via INTRAVENOUS
  Filled 2020-10-06: qty 100

## 2020-10-06 MED ORDER — LIDOCAINE 2% (20 MG/ML) 5 ML SYRINGE
INTRAMUSCULAR | Status: DC | PRN
  Administered 2020-10-06: 40 mg via INTRAVENOUS

## 2020-10-06 MED ORDER — PHENYLEPHRINE 40 MCG/ML (10ML) SYRINGE FOR IV PUSH (FOR BLOOD PRESSURE SUPPORT)
PREFILLED_SYRINGE | INTRAVENOUS | Status: AC
  Filled 2020-10-06: qty 10

## 2020-10-06 MED ORDER — SENNOSIDES-DOCUSATE SODIUM 8.6-50 MG PO TABS: 1.0000 | ORAL_TABLET | Freq: Every evening | ORAL | Status: DC | PRN

## 2020-10-06 MED ORDER — SODIUM CHLORIDE 0.9 % IV SOLN: INTRAVENOUS | Status: DC

## 2020-10-06 MED ORDER — PHENYLEPHRINE HCL (PRESSORS) 10 MG/ML IV SOLN
INTRAVENOUS | Status: DC | PRN
  Administered 2020-10-06 (×2): 120 ug via INTRAVENOUS

## 2020-10-06 MED ORDER — PROPOFOL 10 MG/ML IV BOLUS
INTRAVENOUS | Status: DC | PRN
  Administered 2020-10-06: 30 mg via INTRAVENOUS
  Administered 2020-10-06: 130 mg via INTRAVENOUS

## 2020-10-06 MED ORDER — PHENOL 1.4 % MT LIQD: 1.0000 | OROMUCOSAL | Status: DC | PRN

## 2020-10-06 MED ORDER — HEPARIN SODIUM (PORCINE) 5000 UNIT/ML IJ SOLN
5000.0000 [IU] | Freq: Three times a day (TID) | INTRAMUSCULAR | Status: DC
  Administered 2020-10-07 – 2020-10-11 (×13): 5000 [IU] via SUBCUTANEOUS
  Filled 2020-10-06 (×10): qty 1

## 2020-10-06 MED ORDER — MIDAZOLAM HCL 5 MG/5ML IJ SOLN
INTRAMUSCULAR | Status: DC | PRN
  Administered 2020-10-06 (×2): 1 mg via INTRAVENOUS

## 2020-10-06 MED ORDER — BISACODYL 10 MG RE SUPP
10.0000 mg | Freq: Every day | RECTAL | Status: DC | PRN
Start: 2020-10-06 — End: 2020-10-09

## 2020-10-06 MED ORDER — HYDRALAZINE HCL 20 MG/ML IJ SOLN: 10.0000 mg | INTRAMUSCULAR | Status: DC | PRN

## 2020-10-06 MED ORDER — CHLORHEXIDINE GLUCONATE 0.12 % MT SOLN
15.0000 mL | Freq: Once | OROMUCOSAL | Status: AC
  Administered 2020-10-06: 15 mL via OROMUCOSAL
  Filled 2020-10-06: qty 15

## 2020-10-06 MED ORDER — OXYCODONE HCL 5 MG/5ML PO SOLN
5.0000 mg | Freq: Once | ORAL | Status: DC | PRN
Start: 2020-10-06 — End: 2020-10-06

## 2020-10-06 MED ORDER — 0.9 % SODIUM CHLORIDE (POUR BTL) OPTIME
TOPICAL | Status: DC | PRN
  Administered 2020-10-06: 3000 mL

## 2020-10-06 MED ORDER — PANTOPRAZOLE SODIUM 40 MG IV SOLR
40.0000 mg | Freq: Every day | INTRAVENOUS | Status: DC
  Administered 2020-10-06 – 2020-10-10 (×5): 40 mg via INTRAVENOUS
  Filled 2020-10-06 (×5): qty 40

## 2020-10-06 MED ORDER — FENTANYL CITRATE (PF) 250 MCG/5ML IJ SOLN
INTRAMUSCULAR | Status: DC | PRN
  Administered 2020-10-06 (×9): 50 ug via INTRAVENOUS

## 2020-10-06 MED ORDER — PROTAMINE SULFATE 10 MG/ML IV SOLN
INTRAVENOUS | Status: DC | PRN
  Administered 2020-10-06: 20 mg via INTRAVENOUS
  Administered 2020-10-06: 10 mg via INTRAVENOUS
  Administered 2020-10-06: 20 mg via INTRAVENOUS

## 2020-10-06 SURGICAL SUPPLY — 58 items
BAG COUNTER SPONGE SURGICOUNT (BAG) ×3 IMPLANT
CANISTER SUCT 3000ML PPV (MISCELLANEOUS) ×3 IMPLANT
CATH ROBINSON RED A/P 18FR (CATHETERS) ×6 IMPLANT
CLIP VESOCCLUDE MED 24/CT (CLIP) ×3 IMPLANT
CLIP VESOCCLUDE SM WIDE 24/CT (CLIP) ×3 IMPLANT
DERMABOND ADVANCED (GAUZE/BANDAGES/DRESSINGS) ×4
DERMABOND ADVANCED .7 DNX12 (GAUZE/BANDAGES/DRESSINGS) ×8 IMPLANT
ELECT BLADE 4.0 EZ CLEAN MEGAD (MISCELLANEOUS) ×9
ELECT REM PT RETURN 9FT ADLT (ELECTROSURGICAL) ×3
ELECTRODE BLDE 4.0 EZ CLN MEGD (MISCELLANEOUS) ×6 IMPLANT
ELECTRODE REM PT RTRN 9FT ADLT (ELECTROSURGICAL) ×2 IMPLANT
FELT TEFLON 1X6 (MISCELLANEOUS) ×3 IMPLANT
GLOVE SURG MICRO LTX SZ6.5 (GLOVE) ×3 IMPLANT
GLOVE SURG MICRO LTX SZ7 (GLOVE) ×6 IMPLANT
GLOVE SURG POLYISO LF SZ8 (GLOVE) ×3 IMPLANT
GLOVE SURG UNDER POLY LF SZ6.5 (GLOVE) ×6 IMPLANT
GLOVE SURG UNDER POLY LF SZ7 (GLOVE) ×9 IMPLANT
GOWN STRL REUS W/ TWL LRG LVL3 (GOWN DISPOSABLE) ×4 IMPLANT
GOWN STRL REUS W/ TWL XL LVL3 (GOWN DISPOSABLE) ×2 IMPLANT
GOWN STRL REUS W/TWL LRG LVL3 (GOWN DISPOSABLE) ×2
GOWN STRL REUS W/TWL XL LVL3 (GOWN DISPOSABLE) ×1
GRAFT HEMASHIELD 18X9MM (Vascular Products) ×3 IMPLANT
INSERT FOGARTY SM (MISCELLANEOUS) ×12 IMPLANT
KIT BASIN OR (CUSTOM PROCEDURE TRAY) ×3 IMPLANT
KIT TURNOVER KIT B (KITS) ×3 IMPLANT
LOOP VESSEL MINI RED (MISCELLANEOUS) ×3 IMPLANT
NS IRRIG 1000ML POUR BTL (IV SOLUTION) ×9 IMPLANT
PACK AORTA (CUSTOM PROCEDURE TRAY) ×3 IMPLANT
PAD ARMBOARD 7.5X6 YLW CONV (MISCELLANEOUS) ×6 IMPLANT
SUT MNCRL AB 4-0 PS2 18 (SUTURE) ×12 IMPLANT
SUT PDS AB 1 TP1 96 (SUTURE) ×6 IMPLANT
SUT PROLENE 3 0 SH1 36 (SUTURE) ×9 IMPLANT
SUT PROLENE 4 0 RB 1 (SUTURE) ×2
SUT PROLENE 4-0 RB1 .5 CRCL 36 (SUTURE) ×4 IMPLANT
SUT PROLENE 5 0 C 1 24 (SUTURE) ×9 IMPLANT
SUT PROLENE 6 0 BV (SUTURE) ×18 IMPLANT
SUT SILK 2 0 (SUTURE) ×1
SUT SILK 2 0 SH (SUTURE) ×3 IMPLANT
SUT SILK 2 0 SH CR/8 (SUTURE) ×3 IMPLANT
SUT SILK 2 0 TIES 17X18 (SUTURE) ×1
SUT SILK 2 0SH CR/8 30 (SUTURE) ×3 IMPLANT
SUT SILK 2-0 18XBRD TIE 12 (SUTURE) ×2 IMPLANT
SUT SILK 2-0 18XBRD TIE BLK (SUTURE) ×2 IMPLANT
SUT SILK 3 0 (SUTURE) ×2
SUT SILK 3 0 TIES 17X18 (SUTURE) ×1
SUT SILK 3-0 18XBRD TIE 12 (SUTURE) ×4 IMPLANT
SUT SILK 3-0 18XBRD TIE BLK (SUTURE) ×2 IMPLANT
SUT VIC AB 0 CT1 18XCR BRD 8 (SUTURE) ×2 IMPLANT
SUT VIC AB 0 CT1 8-18 (SUTURE) ×1
SUT VIC AB 2-0 CT1 27 (SUTURE) ×3
SUT VIC AB 2-0 CT1 TAPERPNT 27 (SUTURE) ×6 IMPLANT
SUT VIC AB 3-0 SH 27 (SUTURE) ×2
SUT VIC AB 3-0 SH 27X BRD (SUTURE) ×4 IMPLANT
TAPE UMBILICAL 1/8X30 (MISCELLANEOUS) ×3 IMPLANT
TOWEL GREEN STERILE (TOWEL DISPOSABLE) ×3 IMPLANT
TOWEL ~~LOC~~+RFID 17X26 BLUE (SPONGE) ×3 IMPLANT
TRAY FOLEY MTR SLVR 16FR STAT (SET/KITS/TRAYS/PACK) ×3 IMPLANT
WATER STERILE IRR 1000ML POUR (IV SOLUTION) ×6 IMPLANT

## 2020-10-06 NOTE — Anesthesia Postprocedure Evaluation (Signed)
Anesthesia Post Note  Patient: Adrian Washington  Procedure(s) Performed: AORTA BIFEMORAL BYPASS GRAFT AND  OMENTOPEXY (Bilateral: Abdomen) BILATERAL FEMORAL ENDARTERECTOMY WITH PROFUNDOPLASTY (Bilateral)     Patient location during evaluation: PACU Anesthesia Type: General Level of consciousness: awake and alert Pain management: pain level controlled Vital Signs Assessment: post-procedure vital signs reviewed and stable Respiratory status: spontaneous breathing, nonlabored ventilation, respiratory function stable and patient connected to nasal cannula oxygen Cardiovascular status: blood pressure returned to baseline and stable Postop Assessment: no apparent nausea or vomiting Anesthetic complications: no   No notable events documented.  Last Vitals:  Vitals:   10/06/20 1429 10/06/20 1443  BP: (!) 151/86 (!) 148/92  Pulse: 67 70  Resp: 19 19  Temp:  36.9 C  SpO2: 93% 95%    Last Pain:  Vitals:   10/06/20 1443  TempSrc:   PainSc: Asleep                 Beryle Lathe

## 2020-10-06 NOTE — Transfer of Care (Signed)
Immediate Anesthesia Transfer of Care Note  Patient: Adrian Washington  Procedure(s) Performed: AORTA BIFEMORAL BYPASS GRAFT AND  OMENTOPEXY (Bilateral: Abdomen) BILATERAL FEMORAL ENDARTERECTOMY WITH PROFUNDOPLASTY (Bilateral)  Patient Location: PACU  Anesthesia Type:General  Level of Consciousness: awake, patient cooperative and responds to stimulation  Airway & Oxygen Therapy: Patient Spontanous Breathing and Patient connected to face mask oxygen  Post-op Assessment: Report given to RN and Post -op Vital signs reviewed and stable  Post vital signs: Reviewed and stable  Last Vitals:  Vitals Value Taken Time  BP 167/83 10/06/20 1414  Temp    Pulse 65 10/06/20 1415  Resp 15 10/06/20 1415  SpO2 100 % 10/06/20 1415  Vitals shown include unvalidated device data.  Last Pain:  Vitals:   10/06/20 0611  TempSrc:   PainSc: 0-No pain         Complications: No notable events documented.

## 2020-10-06 NOTE — Discharge Instructions (Signed)
 [facility]  Discharge Instructions   Open Aortic Surgery  Please refer to the following instructions for your post-procedure care. Your surgeon or Physician Assistant will discuss any changes with you.  Activity  Avoid lifting more than eight pounds (a gallon of milk) until after your first post-operative visit. You are encouraged to walk as much as you can. You can slowly return to normal activities but must avoid strenuous activity and heavy lifting until your doctor tells you it's okay. Heavy lifting can hurt the incision and cause a hernia. Avoid activities such as vacuuming or swinging a golf club. It is normal to feel tired for several weeks after your surgery. Do not drive until your doctor gives the okay and you are no longer taking prescription pain medications. It is also normal to have difficulty with sleep habits, eating and bowl movements after surgery. These will go away with time.  Bathing/Showering  Shower daily after you go home. Do not soak in a bathtub, hot tub, or swim until the incision heals.  Incision Care  Shower every day. Clean your incision with mild soap and water. Pat the area dry with a clean towel. You do not need a bandage unless otherwise instructed. Do not apply any ointments or creams to your incision. You may have skin glue on your incision. Do not peel it off. It will come off on its own in about one week. If you have staples or sutures along your incision, they will be removed at your post op appointment.  If you have groin incisions, wash the groin wounds with soap and water daily and pat dry. (No tub bath-only shower)  Then put a dry gauze or washcloth in the groin to keep this area dry to help prevent wound infection.  Do this daily and as needed.  Do not use Vaseline or neosporin on your incisions.  Only use soap and water on your incisions and then protect and keep dry.  Diet  Resume your normal diet. There are no  special food restriction following this procedure. A low fat/low cholesterol diet is recommended for all patients with vascular disease. After your aortic surgery, it's normal to feel full faster than usual and to not feel as hungry as you normally would. You will probably lose weight initially following your surgery. It's best to eat small, frequent meals over the course of the day. Call the office if you find that you are unable to eat even small meals.   In order to heal from your surgery, it is CRITICAL to get adequate nutrition. Your body requires vitamins, minerals, and protein. Vegetables are the best source of vitamins and minerals. If you have pain, you may take over-the-counter pain reliever such as acetaminophen (Tylenol). If you were prescribed a stronger pain medication, please be aware these medication can cause nausea and constipation. Prevent nausea by taking the medication with a snack or meal. Avoid constipation by drinking plenty of fluids and eating foods with a high amount of fiber, such as fruits, vegetables and grains. Take 100mg of the over-the-counter stool softener Colace twice a day as needed to help with constipation. A laxative, such as Milk of Magnesia, may be recommended for you at this time. Do not take a laxative unless your surgeon or P.A. tells you it's OK.  Do not take Tylenol if you are taking stronger pain medications (such as Percocet).  Follow Up  Our office will schedule a follow up   appointment 2-3 weeks after discharge.  Please call us immediately for any of the following conditions    .     Severe or worsening pain in your legs or feet or in your abdomen back or chest. Increased pain, redness drainage (pus) from your incision site. Increased abdominal pain, bloating, nausea, vomiting, or persistent diarrhea. Fever of 101 degrees or higher. Swelling in your leg (s).  Reduce your risk of vascular disease  Stop smoking. If you would like help, call  [facility] at 1-800-QUIT-NOW (1-800-784-8669) or [place] at [phone]. Manage your cholesterol Maintain a desired weight Control your diabetes Keep your blood pressure down  If you have any questions please call the office at [phone].   

## 2020-10-06 NOTE — Anesthesia Procedure Notes (Signed)
Procedure Name: Intubation Date/Time: 10/06/2020 7:42 AM Performed by: Glynda Jaeger, CRNA Pre-anesthesia Checklist: Patient identified, Patient being monitored, Timeout performed, Emergency Drugs available and Suction available Patient Re-evaluated:Patient Re-evaluated prior to induction Oxygen Delivery Method: Circle System Utilized Preoxygenation: Pre-oxygenation with 100% oxygen Induction Type: IV induction Ventilation: Mask ventilation without difficulty Laryngoscope Size: Mac and 4 Grade View: Grade I Tube type: Oral Tube size: 7.0 mm Number of attempts: 1 Airway Equipment and Method: Stylet Placement Confirmation: ETT inserted through vocal cords under direct vision, positive ETCO2 and breath sounds checked- equal and bilateral Secured at: 22 cm Tube secured with: Tape Dental Injury: Teeth and Oropharynx as per pre-operative assessment

## 2020-10-06 NOTE — Interval H&P Note (Signed)
History and Physical Interval Note:  10/06/2020 7:10 AM  Adrian Washington  has presented today for surgery, with the diagnosis of aortoiliac occlusive disease.  The various methods of treatment have been discussed with the patient and family. After consideration of risks, benefits and other options for treatment, the patient has consented to  Procedure(s): AORTA BIFEMORAL BYPASS GRAFT (Bilateral) as a surgical intervention.  The patient's history has been reviewed, patient examined, no change in status, stable for surgery.  I have reviewed the patient's chart and labs.  Questions were answered to the patient's satisfaction.     Leonie Douglas

## 2020-10-06 NOTE — Anesthesia Procedure Notes (Signed)
Arterial Line Insertion Start/End8/19/2022 7:15 AM, 10/06/2020 7:20 AM Performed by: Adair Laundry, CRNA, CRNA  Preanesthetic checklist: patient identified, IV checked, site marked, risks and benefits discussed, surgical consent, monitors and equipment checked, pre-op evaluation, timeout performed and anesthesia consent Lidocaine 1% used for infiltration and patient sedated Right, radial was placed Catheter size: 20 G Hand hygiene performed  and maximum sterile barriers used  Allen's test indicative of satisfactory collateral circulation Attempts: 1 Procedure performed without using ultrasound guided technique. Following insertion, dressing applied and Biopatch. Post procedure assessment: normal  Patient tolerated the procedure well with no immediate complications.

## 2020-10-06 NOTE — Op Note (Signed)
DATE OF SERVICE: 10/06/2020  PATIENT:  Adrian Washington  70 y.o. 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  ANESTHESIA:   general  EBL:  BLOOD ADMINISTERED: 260 CC CELLSAVER  URINE OUTPUT: 1L  DRAINS: none   LOCAL MEDICATIONS USED:  NONE  SPECIMEN:  none  COUNTS: confirmed correct.  TOURNIQUET:  none  PATIENT DISPOSITION:  PACU - hemodynamically stable.   Delay start of Pharmacological VTE agent (>24hrs) due to surgical blood loss or risk of bleeding: no  INDICATION FOR PROCEDURE: Adrian Washington is a 70 y.o. male with ischemic rest pain.  CT angiogram revealed aortoiliac occlusion from the aortic bifurcation to the common femoral arteries bilaterally.  Preoperative restratification was performed with cardiology.  He was deemed a good operative candidate.  After careful discussion of risks, benefits, and alternatives the patient was offered aortobifemoral bypass grafting. We specifically discussed risk of death, stroke, MI, renal failure, respiratory failure, etc. The patient understood and wished to proceed.  OPERATIVE FINDINGS: Heavily calcified femoral arteries bilaterally required extensive endarterectomy and profundoplasty.  Unremarkable aortobifemoral bypass.  Infrarenal clamp x28 minutes.  Distal stump oversewn.  IMA not reimplanted.  Distal anastomoses laid down as patch onto profunda arteries bilaterally.  Good Doppler flow in profundus bilaterally at case completion.  DESCRIPTION OF PROCEDURE: After identification of  the patient in the pre-operative holding area, the patient was transferred to the operating room. The patient was positioned supine on the operating room table. Anesthesia was induced. The chest, abdomen, groins, thighs were prepped and draped in standard fashion. A surgical pause was performed confirming correct patient, procedure, and operative location.  Longitudinal incision was made over the right common femoral artery.  Incision was carried down through subcutaneous tissue until the femoral sheath was encountered.  This was divided carefully.  The femoral arteries were identified and skeletonized.  The profunda was exposed onto second order branches to allow extensive endarterectomy.  Exposure was carried cranially under the inguinal ligament.  The circumflex iliac vein was identified and divided.  Retroperitoneal tunnel was started over the external iliac artery into the abdomen.  Longitudinal incision was made over the left common femoral artery.  Incision was carried down through subcutaneous tissue until the femoral sheath was encountered.  This was divided carefully.  The femoral arteries were identified and skeletonized.  The profunda was exposed onto second order branches to allow extensive endarterectomy.  Exposure was carried cranially under the inguinal ligament.  The circumflex iliac vein was identified and divided.  Retroperitoneal tunnel was started over the external iliac artery into the abdomen.  Midline laparotomy was made from xiphoid to suprapubic abdomen.  The incision was carried out the subtenons tissue until the anterior rectus fascia was identified.  The fascia was identified at the decussation of fibers, and we entered in the midline.  The peritoneum was sharply incised.  The visceral contents were then protected digitally and the remainder of the fascia incised using Bovie electrocautery.  The abdomen was inspected no unexpected findings were encountered.  Saline moistened our  towels were placed across the abdominal wall.  A Balfour self-retaining retractor was used to hold the abdominal wall open laterally.  The transverse colon was eviscerated, retracted cranially, and wrapped in a saline moistened laparotomy pad.  The small bowel was eviscerated and wrapped in a saline moistened laparotomy pad to the right of the abdomen.  The retroperitoneum was inspected.  Patient was quite thin and the aorta easily identified in the retroperitoneum.  An Omni-Tract self-retaining system was brought onto the field and put into position offense was used to retract the small bowel to the right and a splanchnic retractor used to hold the colon cranially.  I exposed the anterior surface of the aorta and the retroperitoneum using Bovie electrocautery.  I carried the exposure cranially until the left renal vein was identified.  Exposure was carried caudally until the bifurcation of the aorta.  The inferior mesenteric artery was identified and looped.  Circumferential exposure of the infrarenal aorta was performed with a combination of sharp and Bovie cautery dissection.  I was ultimately able to navigate a right angle clamp behind the aorta and passed an umbilical tape around the aorta.  A Harkenson clamp was then slotted into place.  An aortic cross-clamp was then slotted into place above the inferior mesenteric artery.  Retroperitoneal tunnels over the iliac vessels were made carefully using blunt, digital dissection.  I ensured the ureters were not compressed or injured during tunneling.  The tunnels were held open with red rubber catheters.    Patient was systemically heparinized.  Activated clotting time measurements were used throughout the case to confirm adequate anticoagulation.  After careful communication with anesthesia the aorta was crossclamped proximally distally.  I transected the aorta several centimeters distal to our clamp site.  Limited aortic endarterectomy was needed.  An 18 x 9 mm  dacryon bifurcated graft was brought on to the field.  The main body was cut to length of about 3 cm.  I felt strip was cut to buttress the proximal anastomosis.  Proximal anastomosis was performed using continuous running suture of 3-0 Prolene.  After completion the anastomosis was tested by aggressively flushing saline into the graft.  1 area of leak was repaired.  The clamp was released.  The anastomosis was found to be hemostatic.  The clamp was transitioned on to the graft.  A proximal anastomosis took 28 minutes.  No suprarenal or supraceliac clamping was needed.  The distal stump of the aorta was oversewn in 2 layers using 3-0 Prolene suture.  The proximal anastomosis was packed and the limbs of the graft were delivered into the red rubber catheter and then through the retroperitoneal tunnels to our femoral artery exposures.  The right femoral vessels were clamped proximally and distally.  A longitudinal arteriotomy was made in the common femoral artery with an 11 blade and extended with Potts scissors several centimeters onto the profunda femoris artery.  Endarterectomy and profundoplasty were then performed with standard technique using a freer elevator.  I did perform eversion endarterectomy of the superficial femoral artery.  The distal endpoint on the profunda feathered nicely.  Tacking sutures were used to hold down the endpoint.  The distal end of the aortobifemoral limb was stretched to length and spatulated to allow generous end-to-side anastomosis with the arteriotomy.  This was performed using continuous running suture of 5-0 Prolene.  Immediately prior to completion the anastomosis was flushed and de-aired.  Clamps were released sequentially.  Excellent Doppler flow was confirmed in the profunda femoris artery.  The left femoral  vessels were clamped proximally and distally.  A longitudinal arteriotomy was made in the common femoral artery with an 11 blade and extended with Potts scissors  several centimeters onto the profunda femoris artery.  Endarterectomy and profundoplasty were then performed with standard technique using a freer elevator.  I did perform eversion endarterectomy of the superficial femoral artery.  The distal endpoint on the profunda feathered nicely.  Tacking sutures were used to hold down the endpoint.  The distal end of the aortobifemoral limb was stretched to length and spatulated to allow generous end-to-side anastomosis with the arteriotomy.  This was performed using continuous running suture of 5-0 Prolene.  Immediately prior to completion the anastomosis was flushed and de-aired.  Clamps were released sequentially.  Excellent Doppler flow was confirmed in the profunda femoris artery.  The abdomen was reinspected for hemostasis.  1 area of leak was noted in the proximal anastomosis which was repaired with a single suture of 3-0 Prolene.  The abdomen was copiously irrigated.  Protamine was administered.  Hemostasis was again confirmed in the surgical beds.  A pedicle of greater omentum was harvested from the transverse colon taking great care to avoid injury to the colon.  Bovie electrocautery was used to create a tongue of omentum.  A window in the transverse colon mesentery was created and the tongue of omentum delivered through to the retroperitoneum.  This was tacked over the graft as there was not adequate retroperitoneal tissue to cover the graft.  Several interrupted sutures of 3-0 Vicryl were used.  The transverse colon and small bowel returned to the abdomen.  The abdomen was again copiously irrigated.  The groins were copiously irrigated.  The abdomen was closed using continuous running suture of 0 double looped PDS, interrupted 0 Vicryl internal retention sutures, 4-0 Monocryl.  The groins were closed using interrupted 2-0 Vicryl in several layers, followed by running 3-0 Vicryl and 4-0 Monocryl subcuticular closure.  Dermabond was applied to the  incisions.  Upon completion of the case instrument and sharps counts were confirmed correct. The patient was transferred to the PACU in good condition. I was present for all portions of the procedure.  Rande Brunt. Lenell Antu, MD [facility] Phone Number: [phone] 10/06/2020 1:55 PM

## 2020-10-07 LAB — COMPREHENSIVE METABOLIC PANEL
ALT: 14 U/L (ref 0–44)
AST: 23 U/L (ref 15–41)
Albumin: 3.2 g/dL — ABNORMAL LOW (ref 3.5–5.0)
Alkaline Phosphatase: 63 U/L (ref 38–126)
Anion gap: 6 (ref 5–15)
BUN: 6 mg/dL — ABNORMAL LOW (ref 8–23)
CO2: 25 mmol/L (ref 22–32)
Calcium: 8 mg/dL — ABNORMAL LOW (ref 8.9–10.3)
Chloride: 101 mmol/L (ref 98–111)
Creatinine, Ser: 0.77 mg/dL (ref 0.61–1.24)
GFR, Estimated: >60 mL/min (ref >60)
Glucose, Bld: 120 mg/dL — ABNORMAL HIGH (ref 70–99)
Potassium: 4.2 mmol/L (ref 3.5–5.1)
Sodium: 132 mmol/L — ABNORMAL LOW (ref 135–145)
Total Bilirubin: 0.5 mg/dL (ref 0.3–1.2)
Total Protein: 6 g/dL — ABNORMAL LOW (ref 6.5–8.1)

## 2020-10-07 LAB — MAGNESIUM: Magnesium: 2.2 mg/dL (ref 1.7–2.4)

## 2020-10-07 LAB — CBC
HCT: 30.6 % — ABNORMAL LOW (ref 39.0–52.0)
Hemoglobin: 10.3 g/dL — ABNORMAL LOW (ref 13.0–17.0)
MCH: 32 pg (ref 26.0–34.0)
MCHC: 33.7 g/dL (ref 30.0–36.0)
MCV: 95 fL (ref 80.0–100.0)
Platelets: 182 10*3/uL (ref 150–400)
RBC: 3.22 MIL/uL — ABNORMAL LOW (ref 4.22–5.81)
RDW: 15.2 % (ref 11.5–15.5)
WBC: 11.6 10*3/uL — ABNORMAL HIGH (ref 4.0–10.5)
nRBC: 0 % (ref 0.0–0.2)

## 2020-10-07 NOTE — Progress Notes (Signed)
Pt arrived from 2H, VSS, CHG complete, Orders released, Telebox [id number].   Kalman Jewels, RN 10/07/2020 5:01 PM

## 2020-10-07 NOTE — Evaluation (Signed)
Occupational Therapy Evaluation Patient Details Name: Adrian Washington MRN: [id number] DOB: [birth year] Today's Date: 10/07/2020    History of Present Illness Pt is a 70 year old man admitted on 10/06/20 for aortobifemoral BPG, B femoral endarterectomies with profundoplasty. PMH: CAD, PVD, CHF, depression.   Clinical Impression   Pt was independent prior to admission and living with his fiance. He works for his apartment complex. Presents with post operative pain, generalized weakness and impaired standing balance. He transferred OOB to chair with B hand held min assist. Pt requires set up to total assist for ADL. VSS pm RA, left 02 off, RN aware. Pt likely to progress well and not require post acute OT. Will follow acutely.     Follow Up Recommendations  No OT follow up    Equipment Recommendations  3 in 1 bedside commode    Recommendations for Other Services       Precautions / Restrictions Precautions Precautions: Fall      Mobility Bed Mobility Overal bed mobility: Needs Assistance Bed Mobility: Supine to Sit     Supine to sit: +2 for physical assistance;Max assist     General bed mobility comments: assist to raise trunk, pt cued for log roll technique, but unable to follow through due to distraction of pain    Transfers Overall transfer level: Needs assistance Equipment used: 2 person hand held assist Transfers: Sit to/from Stand;Stand Pivot Transfers Sit to Stand: Min assist;+2 physical assistance Stand pivot transfers: Min assist;+2 physical assistance       General transfer comment: assist to rise and steady as pt took pivotal steps    Balance Overall balance assessment: Needs assistance Sitting-balance support: Bilateral upper extremity supported Sitting balance-Leahy Scale: Fair       Standing balance-Leahy Scale: Poor                             ADL either performed or assessed with clinical judgement   ADL Overall ADL's : Needs  assistance/impaired Eating/Feeding: NPO   Grooming: Set up;Sitting   Upper Body Bathing: Moderate assistance;Sitting   Lower Body Bathing: Total assistance;Sit to/from stand   Upper Body Dressing : Moderate assistance;Sitting   Lower Body Dressing: Total assistance;Sit to/from stand   Toilet Transfer: +2 for physical assistance;Minimal assistance;Stand-pivot;BSC   Toileting- Clothing Manipulation and Hygiene: Total assistance;Sit to/from stand               Vision Baseline Vision/History: Wears glasses Patient Visual Report: No change from baseline       Perception     Praxis      Pertinent Vitals/Pain Pain Assessment: 0-10 Pain Score: 9  Pain Location: incision Pain Descriptors / Indicators: Discomfort;Grimacing;Guarding Pain Intervention(s): Premedicated before session;Monitored during session;Limited activity within patient's tolerance     Hand Dominance Right   Extremity/Trunk Assessment Upper Extremity Assessment Upper Extremity Assessment: Overall WFL for tasks assessed   Lower Extremity Assessment Lower Extremity Assessment: Defer to PT evaluation   Cervical / Trunk Assessment Cervical / Trunk Assessment: Other exceptions Cervical / Trunk Exceptions: abdominal incision   Communication Communication Communication: No difficulties   Cognition Arousal/Alertness: Awake/alert Behavior During Therapy: Flat affect Overall Cognitive Status: Within Functional Limits for tasks assessed                                 General Comments: some difficulty following commands due to distraction  of pain   General Comments       Exercises     Shoulder Instructions      Home Living Family/patient expects to be discharged to:: Private residence Living Arrangements: Spouse/significant other (fiance) Available Help at Discharge: Available 24 hours/day Type of Home: Apartment Home Access: Stairs to enter [employer] of Steps:  flight Entrance Stairs-Rails: Right;Left Home Layout: One level     Bathroom Shower/Tub: Chief Strategy Officer: Standard     Home Equipment: Cane - single point          Prior Functioning/Environment Level of Independence: Independent        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        OT Problem List: Decreased strength;Decreased activity tolerance;Impaired balance (sitting and/or standing);Decreased knowledge of use of DME or AE;Pain      OT Treatment/Interventions: Self-care/ADL training;Therapeutic activities;Patient/family education;Balance training    OT Goals(Current goals can be found in the care plan section) Acute Rehab OT Goals Patient Stated Goal: decrease pain OT Goal Formulation: With patient Time For Goal Achievement: 10/21/20 Potential to Achieve Goals: Good ADL Goals Pt Will Perform Grooming: with supervision;standing Pt Will Perform Lower Body Bathing: with supervision;sit to/from stand Pt Will Perform Lower Body Dressing: with supervision;sit to/from stand Pt Will Transfer to Toilet: with supervision;ambulating;bedside commode (over toilet) Pt Will Perform Toileting - Clothing Manipulation and hygiene: with supervision;sit to/from stand Pt Will Perform Tub/Shower Transfer: Tub transfer;with supervision;ambulating Additional ADL Goal #1: Pt will perform bed mobility modified independently using log roll technique.  OT Frequency: Min 2X/week   Barriers to D/C:            Co-evaluation PT/OT/SLP Co-Evaluation/Treatment: Yes Reason for Co-Treatment: Complexity of the patient's impairments (multi-system involvement)   OT goals addressed during session: ADL's and self-care      AM-PAC OT "6 Clicks" Daily Activity     Outcome Measure Help from another person eating meals?: None Help from another person taking care of personal grooming?: A Little Help from another person toileting, which includes using toliet,  bedpan, or urinal?: Total Help from another person bathing (including washing, rinsing, drying)?: A Lot Help from another person to put on and taking off regular upper body clothing?: A Lot Help from another person to put on and taking off regular lower body clothing?: Total 6 Click Score: 13   End of Session Nurse Communication: Mobility status;Other (comment) (ok to leave 02 off)  Activity Tolerance: Patient limited by pain Patient left: in chair;with call bell/phone within reach  OT Visit Diagnosis: Unsteadiness on feet (R26.81);Other abnormalities of gait and mobility (R26.89);Pain;Muscle weakness (generalized) (M62.81)                Time: 8032-1224 OT Time Calculation (min): 15 min Charges:  OT General Charges $OT Visit: 1 Visit OT Evaluation $OT Eval Moderate Complexity: 1 Mod  Martie Round, OTR/L Acute Rehabilitation Services Pager: [phone] Office: [phone]   Evern Bio 10/07/2020, 9:06 AM

## 2020-10-07 NOTE — Progress Notes (Signed)
   VASCULAR SURGERY ASSESSMENT & PLAN:   POD 1 AORTOBIFEMORAL BYPASS GRAFT: He has Doppler signals in both feet.  He has bilateral SFA occlusions.  He has strong femoral pulses.  PULMONARY: Encourage I-S  CARDIAC: Hemodynamically stable  RENAL: He has excellent urine output and his renal function is normal.  GI/NUTRITION: We will keep n.p.o. until his bowel function returns.  DVT PROPHYLAXIS: He is on subcu heparin.  Transfer to 4 E.   SUBJECTIVE:   No flatus.  No BM.  No nausea.  No specific complaints except for some abdominal incisional pain  PHYSICAL EXAM:   Vitals:   10/07/20 0400 10/07/20 0500 10/07/20 0508 10/07/20 0600  BP: 125/67 (!) 121/94  121/70  Pulse: 76 69  64  Resp: 19 18  (!) 23  Temp: 98.5 F (36.9 C)     TempSrc: Oral     SpO2: 100% 100%  100%  Weight:   53 kg   Height:       Palpable dorsalis pedis pulses. Monophasic DP on the right and monophasic anterior tibial on the left No leg swelling No bowel sounds  LABS:   Lab Results  Component Value Date   WBC 11.6 (H) 10/07/2020   HGB 10.3 (L) 10/07/2020   HCT 30.6 (L) 10/07/2020   MCV 95.0 10/07/2020   PLT 182 10/07/2020   Lab Results  Component Value Date   CREATININE 0.77 10/07/2020   Lab Results  Component Value Date   INR 1.1 10/06/2020    PROBLEM LIST:    Active Problems:   Abdominal aortic stenosis   CURRENT MEDS:    sodium chloride   Intravenous Once   Chlorhexidine Gluconate Cloth  6 each Topical Daily   docusate sodium  100 mg Oral Daily   heparin  5,000 Units Subcutaneous Q8H   pantoprazole (PROTONIX) IV  40 mg Intravenous QHS    Waverly Ferrari Office: [phone] 10/07/2020

## 2020-10-07 NOTE — Plan of Care (Signed)

## 2020-10-07 NOTE — Evaluation (Signed)
Physical Therapy Evaluation Patient Details Name: Adrian Washington MRN: [id number] DOB: [birth year] Today's Date: 10/07/2020   History of Present Illness  Pt is a 70 year old man admitted on 10/06/20 for aortobifemoral BPG, B femoral endarterectomies with profundoplasty. PMH: CAD, PVD, CHF, depression.   Clinical Impression  Pt admitted with above diagnosis. 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 On eval, he required +2 max assist bed mobility, +2 min assist sit to stand, and +2 min assist SPT without AD. Pt currently with functional limitations due to the deficits listed below (see PT Problem List). Pt will benefit from skilled PT to increase their independence and safety with mobility to allow discharge home. Suspect pt will progress quickly with mobility as pain in managed. PT to follow acutely. No follow up services indicated. Pt's fiance is able to provide 24-hour assist at home. PT to further determine if any DME is needed as his mobility progresses.        Follow Up Recommendations No PT follow up    Equipment Recommendations  Other (comment) (TBD)    Recommendations for Other Services       Precautions / Restrictions Precautions Precautions: Fall      Mobility  Bed Mobility Overal bed mobility: Needs Assistance Bed Mobility: Supine to Sit     Supine to sit: +2 for physical assistance;Max assist     General bed mobility comments: assist to raise trunk, pt cued for log roll technique, but unable to follow through due to distraction of pain, cues for sequencing and trunk flexion as pt attempt to maintain extension EOB    Transfers Overall transfer level: Needs assistance Equipment used: 2 person hand held assist Transfers: Sit to/from [employer] Transfers Sit to Stand: Min assist;+2 physical assistance Stand pivot transfers: Min assist;+2 physical assistance       General transfer comment:  assist to rise and steady as pt took pivotal steps  Ambulation/Gait             General Gait Details: unable to progress due to pain  Stairs            Wheelchair Mobility    Modified Rankin (Stroke Patients Only)       Balance Overall balance assessment: Needs assistance Sitting-balance support: Bilateral upper extremity supported;Feet supported Sitting balance-Leahy Scale: Fair     Standing balance support: Bilateral upper extremity supported;During functional activity Standing balance-Leahy Scale: Poor Standing balance comment: reliant on external support                             Pertinent Vitals/Pain Pain Assessment: 0-10 Pain Score: 9  Pain Location: abdominal incision Pain Descriptors / Indicators: Discomfort;Grimacing;Guarding Pain Intervention(s): Premedicated before session;Monitored during session;Limited activity within patient's tolerance;Repositioned    Home Living Family/patient expects to be discharged to:: Private residence Living Arrangements: Spouse/significant other (fiance) Available Help at Discharge: Available 24 hours/day Type of Home: Apartment (2nd floor) Home Access: Stairs to enter Entrance Stairs-Rails: Right;Left Entrance Stairs-Number of Steps: flight Home Layout: One level Home Equipment: Cane - single point      Prior Function Level of Independence: Independent         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     Hand Dominance   Dominant Hand: Right    Extremity/Trunk Assessment   Upper Extremity Assessment Upper Extremity Assessment: Overall WFL for  tasks assessed    Lower Extremity Assessment Lower Extremity Assessment: Overall WFL for tasks assessed    Cervical / Trunk Assessment Cervical / Trunk Assessment: Other exceptions Cervical / Trunk Exceptions: abdominal incision, flexed posture/guarding  Communication   Communication: No difficulties  Cognition  Arousal/Alertness: Awake/alert Behavior During Therapy: Flat affect Overall Cognitive Status: Within Functional Limits for tasks assessed                                 General Comments: some difficulty following commands due to distraction of pain      General Comments General comments (skin integrity, edema, etc.): VSS on RA    Exercises     Assessment/Plan    PT Assessment Patient needs continued PT services  PT Problem List Decreased mobility;Decreased knowledge of precautions;Decreased activity tolerance;Decreased balance;Pain;Decreased knowledge of use of DME       PT Treatment Interventions DME instruction;Therapeutic activities;Gait training;Therapeutic exercise;Patient/family education;Balance training;Stair training;Functional mobility training    PT Goals (Current goals can be found in the Care Plan section)  Acute Rehab PT Goals Patient Stated Goal: decrease pain PT Goal Formulation: With patient Time For Goal Achievement: 10/21/20 Potential to Achieve Goals: Good    Frequency Min 3X/week   Barriers to discharge        Co-evaluation   Reason for Co-Treatment: Complexity of the patient's impairments (multi-system involvement)   OT goals addressed during session: ADL's and self-care       AM-PAC PT "6 Clicks" Mobility  Outcome Measure Help needed turning from your back to your side while in a flat bed without using bedrails?: A Lot Help needed moving from lying on your back to sitting on the side of a flat bed without using bedrails?: Total Help needed moving to and from a bed to a chair (including a wheelchair)?: Total Help needed standing up from a chair using your arms (e.g., wheelchair or bedside chair)?: Total Help needed to walk in hospital room?: Total Help needed climbing 3-5 steps with a railing? : Total 6 Click Score: 7    End of Session   Activity Tolerance: Patient limited by pain Patient left: in chair;with call bell/phone  within reach Nurse Communication: Mobility status PT Visit Diagnosis: Difficulty in walking, not elsewhere classified (R26.2);Pain    Time: 9509-3267 PT Time Calculation (min) (ACUTE ONLY): 14 min   Charges:   PT Evaluation $PT Eval Moderate Complexity: 1 Mod          Aida Raider, PT  Office # [phone] Pager [phone]   Ilda Foil 10/07/2020, 10:04 AM

## 2020-10-08 MED ORDER — HYDROCODONE-ACETAMINOPHEN 5-325 MG PO TABS
1.0000 | ORAL_TABLET | Freq: Four times a day (QID) | ORAL | Status: DC | PRN
  Administered 2020-10-08 – 2020-10-11 (×10): 1 via ORAL
  Filled 2020-10-08 (×10): qty 1

## 2020-10-08 NOTE — Progress Notes (Addendum)
  Progress Note    10/08/2020 8:18 AM 2 Days Post-Op  Subjective:  abdominal pain. Passing gas but no BM yet   Vitals:   10/08/20 0300 10/08/20 0615  BP: (!) 141/93 121/81  Pulse: 79 84  Resp: 11 18  Temp:  99.4 F (37.4 C)  SpO2: 99% 92%   Physical Exam: Cardiac:  regular Lungs:  non labored Incisions:  laparotomy and B groin incisions are c/d/i Extremities:  well perfused 2+ femoral pulses, doppler Dp bilaterally. Feet warm. Motor and sensation intact Abdomen:  soft, tenderness as expected Neurologic: alert and oriented  CBC    Component Value Date/Time   WBC 11.6 (H) 10/07/2020 0255   RBC 3.22 (L) 10/07/2020 0255   HGB 10.3 (L) 10/07/2020 0255   HCT 30.6 (L) 10/07/2020 0255   PLT 182 10/07/2020 0255   MCV 95.0 10/07/2020 0255   MCH 32.0 10/07/2020 0255   MCHC 33.7 10/07/2020 0255   RDW 15.2 10/07/2020 0255    BMET    Component Value Date/Time   NA 132 (L) 10/07/2020 0255   K 4.2 10/07/2020 0255   CL 101 10/07/2020 0255   CO2 25 10/07/2020 0255   GLUCOSE 120 (H) 10/07/2020 0255   BUN 6 (L) 10/07/2020 0255   CREATININE 0.77 10/07/2020 0255   CALCIUM 8.0 (L) 10/07/2020 0255   GFRNONAA >60 10/07/2020 0255   GFRAA >60 02/13/2017 0755    INR    Component Value Date/Time   INR 1.1 10/06/2020 1410     Intake/Output Summary (Last 24 hours) at 10/08/2020 0818 Last data filed at 10/07/2020 1805 Gross per 24 hour  Intake 813.19 ml  Output 2300 ml  Net -1486.81 ml     Assessment/Plan:  70 y.o. male is s/p Aortobifemoral bypass graft 2 Days Post-Op   Incisions are all intact and well appearing BLE well perfused and warm doppler Dp signals bilaterally Pain control PRN. Hopefully transition to Po today Passing flatus but no BM yet Advance to clear liquid diet Good UOP Hemodynamically stable  Encourage IS OOB/ Mobilize as tolerated PT/ OT recommend no follow up  DVT prophylaxis:  sq heparin    Graceann Congress, PA-C [facility] [phone] 10/08/2020 8:18 AM  I have interviewed the patient and examined the patient. I agree with the findings by the PA.  He has bowel sounds.  We will start clears slowly.  His incisions all look fine.  His feet are adequately perfused.  Mobilize.  Cari Caraway, MD

## 2020-10-09 ENCOUNTER — Encounter (HOSPITAL_COMMUNITY): Payer: Self-pay | Admitting: Vascular Surgery

## 2020-10-09 LAB — POCT I-STAT 7, (LYTES, BLD GAS, ICA,H+H)
Acid-Base Excess: 1 mmol/L (ref 0.0–2.0)
Bicarbonate: 25.1 mmol/L (ref 20.0–28.0)
Calcium, Ion: 1.09 mmol/L — ABNORMAL LOW (ref 1.15–1.40)
HCT: 31 % — ABNORMAL LOW (ref 39.0–52.0)
Hemoglobin: 10.5 g/dL — ABNORMAL LOW (ref 13.0–17.0)
O2 Saturation: 99 %
Potassium: 3.1 mmol/L — ABNORMAL LOW (ref 3.5–5.1)
Sodium: 140 mmol/L (ref 135–145)
TCO2: 26 mmol/L (ref 22–32)
pCO2 arterial: 38.4 mmHg (ref 32.0–48.0)
pH, Arterial: 7.423 (ref 7.350–7.450)
pO2, Arterial: 151 mmHg — ABNORMAL HIGH (ref 83.0–108.0)

## 2020-10-09 LAB — POCT ACTIVATED CLOTTING TIME: Activated Clotting Time: 236 seconds

## 2020-10-09 MED ORDER — POLYETHYLENE GLYCOL 3350 17 G PO PACK
17.0000 g | PACK | Freq: Every day | ORAL | Status: DC
  Administered 2020-10-09 – 2020-10-11 (×3): 17 g via ORAL
  Filled 2020-10-09 (×3): qty 1

## 2020-10-09 MED ORDER — BISACODYL 5 MG PO TBEC
5.0000 mg | DELAYED_RELEASE_TABLET | Freq: Every day | ORAL | Status: DC
  Administered 2020-10-09 – 2020-10-11 (×3): 5 mg via ORAL
  Filled 2020-10-09 (×3): qty 1

## 2020-10-09 MED ORDER — DOCUSATE SODIUM 100 MG PO CAPS
200.0000 mg | ORAL_CAPSULE | Freq: Two times a day (BID) | ORAL | Status: DC
  Administered 2020-10-09 – 2020-10-11 (×5): 200 mg via ORAL
  Filled 2020-10-09 (×5): qty 2

## 2020-10-09 NOTE — Progress Notes (Signed)
Occupational Therapy Treatment Patient Details Name: Adrian Washington MRN: [id number] DOB: 09/14/1950 Today's Date: 10/09/2020    History of present illness Pt is a 70 year old man admitted on 10/06/20 for aortobifemoral BPG, B femoral endarterectomies with profundoplasty. PMH: CAD, PVD, CHF, depression.   OT comments  Pt limited by reported high levels of abdominal pain at incision site. Pt overall Min A for UB dressing tasks due to difficulty reaching overhead secondary to pain. Educated on compensatory strategies for LB ADLs to minimize pain at home. Pt declined further OOB attempts or tub transfer assessment today, citing pain. Pt reports fiance will assist with IADLs at home as needed. Plan to assess tub transfers, carryover of LB ADL strategies in next session.    Follow Up Recommendations  No OT follow up    Equipment Recommendations  3 in 1 bedside commode    Recommendations for Other Services      Precautions / Restrictions Precautions Precautions: Fall Precaution Comments: mod fall, abdominal incision Restrictions Weight Bearing Restrictions: No       Mobility Bed Mobility               General bed mobility comments: Long sitting for UB ADLs in bed    Transfers Overall transfer level: Needs assistance Equipment used: None Transfers: Sit to/from Stand Sit to Stand: Supervision         General transfer comment: declined    Balance Overall balance assessment: Mild deficits observed, not formally tested   Sitting balance-Leahy Scale: Normal     Standing balance support: No upper extremity supported;During functional activity Standing balance-Leahy Scale: Good Standing balance comment: supervision to min guard for ambulation in room and out in hallway                           ADL either performed or assessed with clinical judgement   ADL Overall ADL's : Needs assistance/impaired                 Upper Body Dressing : Minimal  assistance;Bed level Upper Body Dressing Details (indicate cue type and reason): Pt noted with t shirt donned over hospital gown. Assisted pt in correcting this with some difficulty reaching overhead due to abdominal incision pain                   General ADL Comments: Pt noted with pajama pants and socks on. Educated on compensatory strategies for LB dressing at home by bringing feet to self, etc to avoid strain on abdominal incision. Educated on use of reacher at home to avoid bending to pick up items from floor. Discussed use  of shower chair with pt reporting he already has shower chair. Pt declined to attempt tub transfer today     Vision   Vision Assessment?: No apparent visual deficits   Perception     Praxis      Cognition Arousal/Alertness: Awake/alert Behavior During Therapy: WFL for tasks assessed/performed Overall Cognitive Status: Within Functional Limits for tasks assessed                                          Exercises     Shoulder Instructions       General Comments VSS on RA    Pertinent Vitals/ Pain       Pain Assessment: 0-10 Pain  Score: 8  Faces Pain Scale: Hurts a little bit Pain Location: abdominal incision Pain Descriptors / Indicators: Discomfort;Grimacing;Guarding Pain Intervention(s): Monitored during session;Premedicated before session (RN gave meds prior to OT entry per pt)  Home Living                                          Prior Functioning/Environment              Frequency  Min 2X/week        Progress Toward Goals  OT Goals(current goals can now be found in the care plan section)  Progress towards OT goals: Progressing toward goals  Acute Rehab OT Goals Patient Stated Goal: decrease pain OT Goal Formulation: With patient Time For Goal Achievement: 10/21/20 Potential to Achieve Goals: Good ADL Goals Pt Will Perform Grooming: with supervision;standing Pt Will Perform Lower  Body Bathing: with supervision;sit to/from stand Pt Will Perform Lower Body Dressing: with supervision;sit to/from stand Pt Will Transfer to Toilet: with supervision;ambulating;bedside commode Pt Will Perform Toileting - Clothing Manipulation and hygiene: with supervision;sit to/from stand Pt Will Perform Tub/Shower Transfer: Tub transfer;with supervision;ambulating Additional ADL Goal #1: Pt will perform bed mobility modified independently using log roll technique.  Plan Discharge plan remains appropriate    Co-evaluation                 AM-PAC OT "6 Clicks" Daily Activity     Outcome Measure   Help from another person eating meals?: None Help from another person taking care of personal grooming?: A Little Help from another person toileting, which includes using toliet, bedpan, or urinal?: A Lot Help from another person bathing (including washing, rinsing, drying)?: A Little Help from another person to put on and taking off regular upper body clothing?: A Little Help from another person to put on and taking off regular lower body clothing?: A Lot 6 Click Score: 17    End of Session    OT Visit Diagnosis: Unsteadiness on feet (R26.81);Other abnormalities of gait and mobility (R26.89);Pain;Muscle weakness (generalized) (M62.81)   Activity Tolerance Patient limited by pain   Patient Left in bed;with call bell/phone within reach;with bed alarm set   Nurse Communication Mobility status        Time: 4580-9983 OT Time Calculation (min): 10 min  Charges: OT General Charges $OT Visit: 1 Visit OT Treatments $Self Care/Home Management : 8-22 mins  Adrian Washington, OTR/L Acute Rehab Services Office: [phone]    Lorre Munroe 10/09/2020, 1:08 PM

## 2020-10-09 NOTE — Progress Notes (Signed)
Mobility Specialist: Progress Note   10/09/20 1559  Mobility  Activity Ambulated in hall  Level of Assistance Contact guard assist, steadying assist  Assistive Device Front wheel walker  Distance Ambulated (ft) 420 ft  Mobility Ambulated with assistance in hallway  Mobility Response Tolerated well  Mobility performed by Mobility specialist  Bed Position Chair  $Mobility charge 1 Mobility   Pre-Mobility: 80 HR, 117/76 BP, 95% SpO2 Post-Mobility: 90 HR, 132/80 BP, 92% SpO2  Pt independent to stand and required contact guard during ambulation d/t spatial awareness deficits. Pt did experience LOB x1 after returning to the room when walking around the end of the bed, minA to recover. Pt to recliner after walk with call bell and phone in reach.   [facility] Karmelo Bass Mobility Specialist Mobility Specialist Phone: [phone]

## 2020-10-09 NOTE — Progress Notes (Addendum)
  Progress Note    10/09/2020 8:28 AM 3 Days Post-Op  Subjective:  no complaints. Wants to eat. Pain improved   Vitals:   10/09/20 0000 10/09/20 0400  BP: (!) 133/93 (!) 129/91  Pulse: 87 94  Resp: 19 17  Temp:  99.3 F (37.4 C)  SpO2: 91%    Physical Exam: Cardiac:  regular Lungs:  non labored Incisions:  laparotomy and b groin incisions are c/d/I. No swelling or hematoma Extremities:  2+ femoral pulses bilaterally, lower extremities well perfused and warm with DP signals bilaterally Abdomen:  soft, mildly tender as expected, non distended Neurologic: alert and oriented  CBC    Component Value Date/Time   WBC 11.6 (H) 10/07/2020 0255   RBC 3.22 (L) 10/07/2020 0255   HGB 10.3 (L) 10/07/2020 0255   HCT 30.6 (L) 10/07/2020 0255   PLT 182 10/07/2020 0255   MCV 95.0 10/07/2020 0255   MCH 32.0 10/07/2020 0255   MCHC 33.7 10/07/2020 0255   RDW 15.2 10/07/2020 0255    BMET    Component Value Date/Time   NA 132 (L) 10/07/2020 0255   K 4.2 10/07/2020 0255   CL 101 10/07/2020 0255   CO2 25 10/07/2020 0255   GLUCOSE 120 (H) 10/07/2020 0255   BUN 6 (L) 10/07/2020 0255   CREATININE 0.77 10/07/2020 0255   CALCIUM 8.0 (L) 10/07/2020 0255   GFRNONAA >60 10/07/2020 0255   GFRAA >60 02/13/2017 0755    INR    Component Value Date/Time   INR 1.1 10/06/2020 1410    No intake or output data in the 24 hours ending 10/09/20 2979   Assessment/Plan:  70 y.o. male is s/p Aortobifem bypass graft 3 Days Post-Op   Incisions are all intact and well appearing BLE well perfused and warm doppler Dp signals bilaterally Pain control PRN Passing flatus but no BM yet Bowel sounds present Advance to soft diet Good UOP Hemodynamically stable  Encourage IS OOB/ Mobilize as tolerated   DVT prophylaxis:  sq heparin   Graceann Congress, PA-C [facility] [phone] 10/09/2020 8:28 AM  VASCULAR STAFF ADDENDUM: I have independently interviewed and examined  the patient. I agree with the above.  Excellent progress. Incisions clean and dry. Legs no longer painful. Palpable pulse in graft. Diet as tolerated. Aggressive bowel regimen. Mobilize.  Likely home in next 24-48 hours.  Rande Brunt. Lenell Antu, MD [facility] Phone Number: [phone] 10/09/2020 11:45 AM

## 2020-10-09 NOTE — Progress Notes (Signed)
Physical Therapy Treatment Patient Details Name: Adrian Washington MRN: [id number] DOB: 31-Dec-1950 Today's Date: 10/09/2020    History of Present Illness Pt is a 70 year old man admitted on 10/06/20 for aortobifemoral BPG, B femoral endarterectomies with profundoplasty. PMH: CAD, PVD, CHF, depression.    PT Comments    Patient received in recliner, reports he has not been allowed to eat anything. Frustrated about that. Agrees to PT session. He reports his pain is much improved and he has been up some in room. Patient is supervision for sit to stand transfer, ambulated 130 feet without AD, supervision. Slow steady pace. Patient will continue to benefit from skilled PT while here to improve mobility and strength for safe return home.     Follow Up Recommendations  No PT follow up     Equipment Recommendations  None recommended by PT    Recommendations for Other Services       Precautions / Restrictions Precautions Precaution Comments: mod fall Restrictions Weight Bearing Restrictions: No    Mobility  Bed Mobility               General bed mobility comments: Patient received in recliner and remained in recliner at end of session    Transfers Overall transfer level: Needs assistance Equipment used: None Transfers: Sit to/from Stand Sit to Stand: Supervision            Ambulation/Gait Ambulation/Gait assistance: Supervision Gait Distance (Feet): 130 Feet Assistive device: None Gait Pattern/deviations: Step-through pattern;Decreased step length - right;Decreased step length - left Gait velocity: decr   General Gait Details: patient with improved mobility and independence this session. Pain improved.   Stairs             Wheelchair Mobility    Modified Rankin (Stroke Patients Only)       Balance Overall balance assessment: Mild deficits observed, not formally tested   Sitting balance-Leahy Scale: Normal     Standing balance support: No upper  extremity supported;During functional activity Standing balance-Leahy Scale: Good Standing balance comment: supervision to min guard for ambulation in room and out in hallway                            Cognition Arousal/Alertness: Awake/alert Behavior During Therapy: [facility] for tasks assessed/performed Overall Cognitive Status: Within Functional Limits for tasks assessed                                        Exercises      General Comments        Pertinent Vitals/Pain Pain Assessment: Faces Faces Pain Scale: Hurts a little bit Pain Location: abdominal incision Pain Descriptors / Indicators: Discomfort;Grimacing;Guarding Pain Intervention(s): Monitored during session;Repositioned    Home Living                      Prior Function            PT Goals (current goals can now be found in the care plan section) Acute Rehab PT Goals Patient Stated Goal: to eat PT Goal Formulation: With patient Time For Goal Achievement: 10/21/20 Potential to Achieve Goals: Good Progress towards PT goals: Progressing toward goals    Frequency    Min 3X/week      PT Plan Current plan remains appropriate    Co-evaluation  AM-PAC PT "6 Clicks" Mobility   Outcome Measure  Help needed turning from your back to your side while in a flat bed without using bedrails?: None Help needed moving from lying on your back to sitting on the side of a flat bed without using bedrails?: None Help needed moving to and from a bed to a chair (including a wheelchair)?: None Help needed standing up from a chair using your arms (e.g., wheelchair or bedside chair)?: None Help needed to walk in hospital room?: A Little Help needed climbing 3-5 steps with a railing? : A Little 6 Click Score: 22    End of Session   Activity Tolerance: Patient limited by fatigue Patient left: in chair;with call bell/phone within reach Nurse Communication: Mobility  status PT Visit Diagnosis: Difficulty in walking, not elsewhere classified (R26.2);Pain;Muscle weakness (generalized) (M62.81) Pain - part of body:  (abdomen)     Time: 7092-9574 PT Time Calculation (min) (ACUTE ONLY): 11 min  Charges:  $Gait Training: 8-22 mins                    Rhys Lichty, PT, GCS 10/09/20,9:48 AM

## 2020-10-10 LAB — TYPE AND SCREEN
ABO/RH(D): A POS
Antibody Screen: NEGATIVE
Unit division: 0
Unit division: 0

## 2020-10-10 LAB — BPAM RBC
Blood Product Expiration Date: 202209072359
Blood Product Expiration Date: 202209072359
ISSUE DATE / TIME: 202208190755
ISSUE DATE / TIME: 202208190755
Unit Type and Rh: 6200
Unit Type and Rh: 6200

## 2020-10-10 MED FILL — Heparin Sodium (Porcine) Inj 1000 Unit/ML: INTRAMUSCULAR | Qty: 30 | Status: AC

## 2020-10-10 MED FILL — Sodium Chloride Irrigation Soln 0.9%: Qty: 3000 | Status: AC

## 2020-10-10 MED FILL — Sodium Chloride IV Soln 0.9%: INTRAVENOUS | Qty: 1000 | Status: AC

## 2020-10-10 NOTE — Progress Notes (Signed)
Mobility Specialist: Progress Note   10/10/20 1134  Mobility  Activity Ambulated in hall  Level of Assistance Contact guard assist, steadying assist  Assistive Device Front wheel walker  Distance Ambulated (ft) 420 ft  Mobility Ambulated with assistance in hallway  Mobility Response Tolerated well  Mobility performed by Mobility specialist  Bed Position Chair  $Mobility charge 1 Mobility   Pre-Mobility: 71 HR, 89/62 BP, 93% SpO2 Post-Mobility: 79 HR, 111/68 BP, 95% SpO2  Pt contact guard during ambulation. Pt slightly laterally unsteady during ambulation. Pt did c/o incisional pain he rated 7.5/10, otherwise asx. Pt back to the recliner after walk with call bell and phone in reach.   [facility] Yovani Cogburn Mobility Specialist Mobility Specialist Phone: [phone]

## 2020-10-10 NOTE — Progress Notes (Addendum)
  Progress Note    10/10/2020 7:38 AM 4 Days Post-Op  Subjective:  no complaints.  No BM yesterday but denies N/V   Vitals:   10/10/20 0100 10/10/20 0340  BP: 120/81 101/77  Pulse: 86 79  Resp: 18 15  Temp: 98.2 F (36.8 C) 98.2 F (36.8 C)  SpO2: 96% 93%   Physical Exam: Lungs:  non labored Incisions:  groin and abd incisions c/d/i Extremities:  feet warm and well perfused Neurologic: A&O  CBC    Component Value Date/Time   WBC 11.6 (H) 10/07/2020 0255   RBC 3.22 (L) 10/07/2020 0255   HGB 10.3 (L) 10/07/2020 0255   HCT 30.6 (L) 10/07/2020 0255   PLT 182 10/07/2020 0255   MCV 95.0 10/07/2020 0255   MCH 32.0 10/07/2020 0255   MCHC 33.7 10/07/2020 0255   RDW 15.2 10/07/2020 0255    BMET    Component Value Date/Time   NA 132 (L) 10/07/2020 0255   K 4.2 10/07/2020 0255   CL 101 10/07/2020 0255   CO2 25 10/07/2020 0255   GLUCOSE 120 (H) 10/07/2020 0255   BUN 6 (L) 10/07/2020 0255   CREATININE 0.77 10/07/2020 0255   CALCIUM 8.0 (L) 10/07/2020 0255   GFRNONAA >60 10/07/2020 0255   GFRAA >60 02/13/2017 0755    INR    Component Value Date/Time   INR 1.1 10/06/2020 1410     Intake/Output Summary (Last 24 hours) at 10/10/2020 7902 Last data filed at 10/09/2020 1300 Gross per 24 hour  Intake 4355.8 ml  Output --  Net 4355.8 ml     Assessment/Plan:  71 y.o. male is s/p ABF bypass 4 Days Post-Op   BLE warm and well perfused Abd and groin incisions healing well Tolerating heart healthy diet; prn laxative ordered No HH PT/OT recommended based on evals Home after bowel function returns   Emilie Rutter, PA-C [facility] [phone] 10/10/2020 7:38 AM   VASCULAR STAFF ADDENDUM: I have independently interviewed and examined the patient. I agree with the above.  Ready for discharge, just awaiting return of bowel function. Mobilize as able.  Rande Brunt. Lenell Antu, MD [facility] Phone Number:  [phone] 10/10/2020 8:12 AM

## 2020-10-11 MED ORDER — HYDROCODONE-ACETAMINOPHEN 5-325 MG PO TABS: 1.0000 | ORAL_TABLET | Freq: Four times a day (QID) | ORAL | 0 refills | Status: AC | PRN

## 2020-10-11 MED ORDER — ATORVASTATIN CALCIUM 40 MG PO TABS
40.0000 mg | ORAL_TABLET | Freq: Every day | ORAL | Status: DC
  Administered 2020-10-11: 40 mg via ORAL
  Filled 2020-10-11: qty 1

## 2020-10-11 MED ORDER — ASPIRIN EC 81 MG PO TBEC
81.0000 mg | DELAYED_RELEASE_TABLET | Freq: Every day | ORAL | Status: DC
  Administered 2020-10-11: 81 mg via ORAL
  Filled 2020-10-11: qty 1

## 2020-10-11 MED ORDER — DOCUSATE SODIUM 100 MG PO CAPS: 200.0000 mg | ORAL_CAPSULE | Freq: Two times a day (BID) | ORAL | 0 refills | Status: AC

## 2020-10-11 NOTE — Plan of Care (Signed)
°  Problem: Education: °Goal: Understanding of CV disease, CV risk reduction, and recovery process will improve °Outcome: Adequate for Discharge °Goal: Individualized Educational Video(s) °Outcome: Adequate for Discharge °  °

## 2020-10-11 NOTE — Progress Notes (Signed)
Occupational Therapy Treatment Patient Details Name: Adrian Washington MRN: [id number] DOB: Aug 29, 1950 Today's Date: 10/11/2020    History of present illness Pt is a 70 year old man admitted on 10/06/20 for aortobifemoral BPG, B femoral endarterectomies with profundoplasty. PMH: CAD, PVD, CHF, depression.   OT comments  Treatment focused on patient performing self care tasks to demonstrate independence prior to return home. Patinet able to perform LB dressing with setup only at edge of bed. Patient able to ambulate in room with RW with min guard. One loss of balance to the right that he was able to correct. Patient wanting a walker for home. Patient able to perform pseudo tub transfer with a hand hold. Therapist recommended shower chair and patient thinks he has one. Overall min guard for safety with all tasks. Recommend patient has intermittent supervision initially on return home. He reports he has assistance of his fiance.    Follow Up Recommendations  No OT follow up;Supervision - Intermittent    Equipment Recommendations  Tub/shower seat (Patient requesting a RW)    Recommendations for Other Services      Precautions / Restrictions Precautions Precautions: Fall Precaution Comments: mod fall, abdominal incision Restrictions Weight Bearing Restrictions: No       Mobility Bed Mobility Overal bed mobility: Modified Independent             General bed mobility comments: Mod I for both in and out of bed.    Transfers Overall transfer level: Needs assistance Equipment used: Rolling walker (2 wheeled) Transfers: Sit to/from [employer] Sit to Stand: Min guard         General transfer comment: overall min guard to ambulate in room with RW. Verbal cues to keep the walker closer. Patient reporting wanting walker for home. Patient able to ambulate in room with one loss of balance to the left that he was able to correct.    Balance Overall balance assessment:  Needs assistance Sitting-balance support: No upper extremity supported Sitting balance-Leahy Scale: Normal     Standing balance support: During functional activity;Single extremity supported Standing balance-Leahy Scale: Fair Standing balance comment: at least one UE single support needed for dynamic balance                           ADL either performed or assessed with clinical judgement   ADL Overall ADL's : Needs assistance/impaired                 Upper Body Dressing : Set up Upper Body Dressing Details (indicate cue type and reason): With setup only patient able to don pants and slippers.             Tub/ Shower Transfer: Insurance risk surveyor Details (indicate cue type and reason): Performed pseudo tub transfer. Patient needs a hand hold while stepping over but otherwise did well. Reports he thinks he has a shower chair. Functional mobility during ADLs: Min guard;Rolling walker       Vision Patient Visual Report: No change from baseline     Perception     Praxis      Cognition Arousal/Alertness: Awake/alert Behavior During Therapy: WFL for tasks assessed/performed Overall Cognitive Status: Within Functional Limits for tasks assessed  Exercises     Shoulder Instructions       General Comments      Pertinent Vitals/ Pain       Pain Assessment: Faces Faces Pain Scale: Hurts a little bit Pain Location: abdominal incision Pain Descriptors / Indicators: Discomfort;Grimacing Pain Intervention(s): Limited activity within patient's tolerance  Home Living                                          Prior Functioning/Environment              Frequency  Min 2X/week        Progress Toward Goals  OT Goals(current goals can now be found in the care plan section)  Progress towards OT goals: Progressing toward goals  Acute Rehab OT Goals Patient  Stated Goal: go home OT Goal Formulation: With patient Time For Goal Achievement: 10/21/20 Potential to Achieve Goals: Good  Plan Discharge plan remains appropriate    Co-evaluation          OT goals addressed during session: ADL's and self-care      AM-PAC OT "6 Clicks" Daily Activity     Outcome Measure   Help from another person eating meals?: None Help from another person taking care of personal grooming?: A Little Help from another person toileting, which includes using toliet, bedpan, or urinal?: A Little Help from another person bathing (including washing, rinsing, drying)?: A Little Help from another person to put on and taking off regular upper body clothing?: A Little Help from another person to put on and taking off regular lower body clothing?: A Little 6 Click Score: 19    End of Session Equipment Utilized During Treatment: Rolling walker  OT Visit Diagnosis: Unsteadiness on feet (R26.81);Other abnormalities of gait and mobility (R26.89);Pain;Muscle weakness (generalized) (M62.81)   Activity Tolerance Patient tolerated treatment well   Patient Left in bed;with call bell/phone within reach;with bed alarm set   Nurse Communication Mobility status        Time: 4431-5400 OT Time Calculation (min): 12 min  Charges: OT General Charges $OT Visit: 1 Visit OT Treatments $Self Care/Home Management : 8-22 mins  Waldron Session, OTR/L Acute Care Rehab Services  Office [phone] Pager: [phone]    Kelli Churn 10/11/2020, 11:16 AM

## 2020-10-11 NOTE — Progress Notes (Addendum)
  Progress Note    10/11/2020 6:51 AM 5 Days Post-Op  Subjective:  "I'm ready to go home"  says he had BM x 2.  Afebrile HR 60's-90's  90's-100's systolic   Vitals:   10/10/20 1583 10/11/20 0356  BP: 94/68 (!) 102/57  Pulse: 80 68  Resp: 19 17  Temp: 98.7 F (37.1 C) 98.8 F (37.1 C)  SpO2: 96% 93%    Physical Exam: Cardiac:  regular Lungs:  non labored Incisions:  midline and bilateral groins healing nicely Extremities:  bilateral feet are warm and well perfused Abdomen:  soft, NT/ND; +BM x 2  CBC    Component Value Date/Time   WBC 11.6 (H) 10/07/2020 0255   RBC 3.22 (L) 10/07/2020 0255   HGB 10.3 (L) 10/07/2020 0255   HCT 30.6 (L) 10/07/2020 0255   PLT 182 10/07/2020 0255   MCV 95.0 10/07/2020 0255   MCH 32.0 10/07/2020 0255   MCHC 33.7 10/07/2020 0255   RDW 15.2 10/07/2020 0255    BMET    Component Value Date/Time   NA 132 (L) 10/07/2020 0255   K 4.2 10/07/2020 0255   CL 101 10/07/2020 0255   CO2 25 10/07/2020 0255   GLUCOSE 120 (H) 10/07/2020 0255   BUN 6 (L) 10/07/2020 0255   CREATININE 0.77 10/07/2020 0255   CALCIUM 8.0 (L) 10/07/2020 0255   GFRNONAA >60 10/07/2020 0255   GFRAA >60 02/13/2017 0755    INR    Component Value Date/Time   INR 1.1 10/06/2020 1410     Intake/Output Summary (Last 24 hours) at 10/11/2020 0651 Last data filed at 10/11/2020 0358 Gross per 24 hour  Intake --  Output 500 ml  Net -500 ml     Assessment/Plan:  70 y.o. male is s/p:  ABF bypass with bilateral femoral endarterectomies   5 Days Post-Op   -doing well-has had a BM x 2.   -restart home asa/statin -discharge home today and f/u in 2-3 weeks.   Doreatha Massed, PA-C [facility] [phone] 10/11/2020 6:51 AM  VASCULAR STAFF ADDENDUM: I have independently interviewed and examined the patient. I agree with the above.  I agree - ready for DC. Follow up with [place] in 2-3 weeks for wound check / ABI  Rande Brunt. Lenell Antu,  MD [facility] Phone Number: [phone] 10/11/2020 11:17 AM

## 2020-10-11 NOTE — Discharge Summary (Signed)
Open Aortic Surgery Discharge Summary    Adrian Washington 09/29/1950 70 y.o. male  [id number]  Admission Date: 10/06/2020  Discharge Date: 10/11/2020  Physician: Leonie Douglas, MD  Admission Diagnosis: Abdominal aortic stenosis [Q25.1]   HPI:   This is a 70 y.o. male referred to clinic for evaluation of bilateral lower extremity pain.  Patient reports he has significant pain in his calves and thighs when he walks.  He also reports some pain at rest in his feet.  Pain at rest is relieved by dependency.  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  He is a [place] patient.  He is a former smoker.   09/04/20: patient returns to discuss CTA findings. Our office is not functional at the moment, so we discussed the CT findings face to face.    09/19/20: returns to discuss MR findings. We reviewed risks / benefits / alternatives.   VASCULAR SURGICAL HISTORY: prior infrainguinal stenting - patient unclear of details   VASCULAR RISK FACTORS: Negative history of cerebrovascular disease / stroke / transient ischemic attack. Negative history of coronary artery disease. Negative history of diabetes mellitus.  Positive history of smoking. Not actively smoking. Negative history of hypertension.  Negative history of chronic kidney disease. Last GFR > 60 in [place] system.  Negative history of chronic obstructive pulmonary disease.  Hospital Course:  The patient was admitted to the hospital and taken to the operating room on 10/06/2020 and underwent: 1) aorto-bi-femoral bypass (Dacron 18x45mm) 2) right common femoral endarterectomy and profundaplasty 3) left common femoral endarterectomy and profundaplasty 4) Retroperitoneal omentopexy    Findings: Heavily calcified femoral arteries bilaterally required extensive endarterectomy and profundoplasty.  Unremarkable aortobifemoral bypass.  Infrarenal clamp x28 minutes.  Distal stump oversewn.  IMA not reimplanted.  Distal  anastomoses laid down as patch onto profunda arteries bilaterally.  Good Doppler flow in profundus bilaterally at case completion.  The pt tolerated the procedure well and was transported to the PACU in good condition.   By POD 1, pt was doing well with good doppler signals in both feet.  He had strong femoral pulses.  He had good renal function with good UOP.  He was transferred to the progressive unit.   POD 2, pt doing well and was passing flatus but no BM yet.  He was advanced to a clear liquid diet.  Pt had worked with OT/PT and no follow up was recommended.    POD 3, he was advanced to soft diet.  Still no BM at that point.  Continued to encourage ambulation.    POD 5, pt doing well.  Bowel function returned with having BM x 2.  He was discharged home.    CBC    Component Value Date/Time   WBC 11.6 (H) 10/07/2020 0255   RBC 3.22 (L) 10/07/2020 0255   HGB 10.3 (L) 10/07/2020 0255   HCT 30.6 (L) 10/07/2020 0255   PLT 182 10/07/2020 0255   MCV 95.0 10/07/2020 0255   MCH 32.0 10/07/2020 0255   MCHC 33.7 10/07/2020 0255   RDW 15.2 10/07/2020 0255    BMET    Component Value Date/Time   NA 132 (L) 10/07/2020 0255   K 4.2 10/07/2020 0255   CL 101 10/07/2020 0255   CO2 25 10/07/2020 0255   GLUCOSE 120 (H) 10/07/2020 0255   BUN 6 (L) 10/07/2020 0255   CREATININE 0.77 10/07/2020 0255   CALCIUM 8.0 (L) 10/07/2020 0255   GFRNONAA >  60 10/07/2020 0255   GFRAA >60 02/13/2017 0755     Discharge Instructions     Discharge patient   Complete by: As directed    Discharge home after pt has been seen by Dr. Lenell Antu.  Thanks.   Discharge disposition: 01-Home or Self Care   Discharge patient date: 10/11/2020       Discharge Diagnosis:  Abdominal aortic stenosis [Q25.1]  Secondary Diagnosis: Patient Active Problem List   Diagnosis Date Noted   Abdominal aortic stenosis 10/06/2020   Acute medial meniscus tear of right knee 02/04/2017   Past Medical History:  Diagnosis Date    Depression    Hypercholesteremia    PAD (peripheral artery disease) (HCC)    Substance abuse (HCC)    Tobacco abuse      Allergies as of 10/11/2020   No Known Allergies      Medication List     TAKE these medications    acetaminophen 500 MG tablet Commonly known as: TYLENOL Take 500 mg by mouth every 6 (six) hours as needed for moderate pain.   aspirin EC 81 MG tablet Take 81 mg by mouth daily. Swallow whole. What changed: Another medication with the same name was removed. Continue taking this medication, and follow the directions you see here.   atorvastatin 40 MG tablet Commonly known as: LIPITOR Take 40 mg by mouth daily.   docusate sodium 100 MG capsule Commonly known as: COLACE Take 2 capsules (200 mg total) by mouth 2 (two) times daily. Continue to take while taking Norco to help with constipation.   HYDROcodone-acetaminophen 5-325 MG tablet Commonly known as: NORCO/VICODIN Take 1 tablet by mouth every 6 (six) hours as needed for moderate pain.   sildenafil 100 MG tablet Commonly known as: VIAGRA Take 100 mg by mouth daily as needed for erectile dysfunction.   traZODone 100 MG tablet Commonly known as: DESYREL Take 100 mg by mouth at bedtime.        Instructions:  [facility] Discharge Instructions   Open Aortic Surgery  Please refer to the following instructions for your post-procedure care. Your surgeon or Physician Assistant will discuss any changes with you.  Activity  Avoid lifting more than eight pounds (a gallon of milk) until after your first post-operative visit. You are encouraged to walk as much as you can. You can slowly return to normal activities but must avoid strenuous activity and heavy lifting until your doctor tells you it's okay. Heavy lifting can hurt the incision and cause a hernia. Avoid activities such as vacuuming or swinging a golf club. It is normal to feel tired for several weeks after  your surgery. Do not drive until your doctor gives the okay and you are no longer taking prescription pain medications. It is also normal to have difficulty with sleep habits, eating and bowl movements after surgery. These will go away with time.  Bathing/Showering  Shower daily after you go home. Do not soak in a bathtub, hot tub, or swim until the incision heals.  Incision Care  Shower every day. Clean your incision with mild soap and water. Pat the area dry with a clean towel. You do not need a bandage unless otherwise instructed. Do not apply any ointments or creams to your incision. You may have skin glue on your incision. Do not peel it off. It will come off on its own in about one week. If you have staples or sutures along your incision, they will  be removed at your post op appointment.  If you have groin incisions, wash the groin wounds with soap and water daily and pat dry. (No tub bath-only shower)  Then put a dry gauze or washcloth in the groin to keep this area dry to help prevent wound infection.  Do this daily and as needed.  Do not use Vaseline or neosporin on your incisions.  Only use soap and water on your incisions and then protect and keep dry.  Diet  Resume your normal diet. There are no special food restriction following this procedure. A low fat/low cholesterol diet is recommended for all patients with vascular disease. After your aortic surgery, it's normal to feel full faster than usual and to not feel as hungry as you normally would. You will probably lose weight initially following your surgery. It's best to eat small, frequent meals over the course of the day. Call the office if you find that you are unable to eat even small meals.   In order to heal from your surgery, it is CRITICAL to get adequate nutrition. Your body requires vitamins, minerals, and protein. Vegetables are the best source of vitamins and minerals.  If you have pain, you may take over-the-counter pain  reliever such as acetaminophen (Tylenol). If you were prescribed a stronger pain medication, please be aware these medication can cause nausea and constipation. Prevent nausea by taking the medication with a snack or meal. Avoid constipation by drinking plenty of fluids and eating foods with a high amount of fiber, such as fruits, vegetables and grains. Take 100mg  of the over-the-counter stool softener Colace twice a day as needed to help with constipation. A laxative, such as Milk of Magnesia, may be recommended for you at this time. Do not take a laxative unless your surgeon or P.A. tells you it's OK.  Do not take Tylenol if you are taking stronger pain medications (such as Percocet).  Follow Up  Our office will schedule a follow up appointment 2-3 weeks after discharge.  Please call immediately for any of the following conditions    .     Severe or worsening pain in your legs or feet or in your abdomen back or chest. Increased pain, redness drainage (pus) from your incision site. Increased abdominal pain, bloating, nausea, vomiting, or persistent diarrhea. Fever of 101 degrees or higher. Swelling in your leg (s).  Reduce your risk of vascular disease  Stop smoking. If you would like help, call [facility] at 1-800-QUIT-NOW ((715) 473-2572) or [facility] at 724-084-2186. Manage your cholesterol Maintain a desired weight Control your diabetes Keep your blood pressure down  If you have any questions please call the office at [phone].   Prescriptions given: 1.  Norco#30 No Refill 2.  Colace 200mg  bid while taking Norco   Disposition: home  Patient's condition: is Good  Follow up: 1. [facility] in 2-3 weeks   [phone], PA-C [facility] [phone] 10/11/2020  7:29 AM   - For VQI Registry use -  Post-op:  Time to Extubation: [x]  In OR, [ ]  < 12 hrs, [ ]  12-24 hrs, [ ]  >=24 hrs Vasopressors Req. Post-op: No ICU Stay: 1 day in ICU Transfusion:  No   If yes, n/a units given MI: No, [ ]  Troponin only, [ ]  EKG or Clinical New Arrhythmia: No  Complications: CHF: No Resp failure: No, [ ]  Pneumonia, [ ]  Ventilator Chg in renal function: No, [ ]  Inc. Cr > 0.5, [ ]   Temp. Dialysis,  Permanent dialysis Leg ischemia: No, no Surgery needed,  Yes, Surgery needed,  Amputation Bowel ischemia: No,  Medical Rx,  Surgical Rx Wound complication: No,  Superficial separation/infection,  Return to OR Return to OR: No  Return to OR for bleeding: No Stroke: No,  Minor,  Major  Discharge medications: Statin use:  Yes  ASA use:  Yes   Plavix use:  No  Beta blocker use:  No  ACEI use:  No ARB use:  No CCB use:  No Coumadin use:  No

## 2020-10-11 NOTE — Progress Notes (Signed)
Discharge instructions (including medications) discussed with and copy provided to patient/caregiver 

## 2020-10-11 NOTE — Progress Notes (Signed)
Physical Therapy Treatment Patient Details Name: Adrian Washington MRN: [id number] DOB: [birth year] Today's Date: 10/11/2020    History of Present Illness Pt is a 71 year old man admitted on 10/06/20 for aortobifemoral BPG, B femoral endarterectomies with profundoplasty. PMH: CAD, PVD, CHF, depression.    PT Comments    Pt received in supine, agreeable to therapy session and with fair participation and tolerance for session. Pt limited due to pain but willing to perform bed mobility, transfers, brief gait and stair trial with encouragement. Pt mostly modI with safe technique, min cues for bed mobility with manual splinting for decreased pain and pt able to verbalize safe stair sequencing prior to performing unassisted. Pt will need RW for DC home, pt demonstrates improved stability and posture with BUE support for dynamic standing tasks due to post-surgical pain (only has cane at home). Updated DME recs per discussion with pt and supervising PT Jaclyn R.    Follow Up Recommendations  No PT follow up     Equipment Recommendations  Rolling walker with 5" wheels    Recommendations for Other Services       Precautions / Restrictions Precautions Precautions: Fall Precaution Comments: mod fall, abdominal incision Restrictions Weight Bearing Restrictions: No    Mobility  Bed Mobility Overal bed mobility: Needs Assistance Bed Mobility: Sidelying to Sit;Rolling;Sit to Sidelying Rolling: Modified independent (Device/Increase time) Sidelying to sit: Modified independent (Device/Increase time)     Sit to sidelying: Supervision General bed mobility comments: min cues for improved technique returning to bed, pt modI for return demo to sit EOB    Transfers Overall transfer level: Needs assistance Equipment used: Rolling walker (2 wheeled) Transfers: Sit to/from [employer] Sit to Stand: Modified independent (Device/Increase time)         General transfer comment: no LOB,  from EOB<>RW  Ambulation/Gait Ambulation/Gait assistance: Supervision Gait Distance (Feet): 10 Feet Assistive device: Rolling walker (2 wheeled) Gait Pattern/deviations: Step-through pattern;Decreased step length - right;Decreased step length - left     General Gait Details: reliant on RW support due to abdominal pain, self-limiting due to saving energy for upcoming discharge   Stairs Stairs: Yes Stairs assistance: Modified independent (Device/Increase time) Stair Management: Two rails;Forwards;Step to pattern Number of Stairs: 2 General stair comments: pt able to verbalize sequencing prior to peforming, no LOB   Wheelchair Mobility    Modified Rankin (Stroke Patients Only)       Balance Overall balance assessment: Needs assistance Sitting-balance support: No upper extremity supported Sitting balance-Leahy Scale: Normal     Standing balance support: Bilateral upper extremity supported;Single extremity supported Standing balance-Leahy Scale: Fair Standing balance comment: at least one UE single support needed for dynamic balance but does better with BUR support                            Cognition Arousal/Alertness: Awake/alert Behavior During Therapy: WFL for tasks assessed/performed Overall Cognitive Status: Within Functional Limits for tasks assessed                                 General Comments: pt reserved and hypoverbal but cooperative with instructions given; wanting to rest mostly due to upcoming DC      Exercises Other Exercises Other Exercises: encouraged continuing HEP from previous admissions in pain-free range and increased progressive mobility    General Comments General comments (skin integrity, edema, etc.):  VSS on RA, pt defer BP assessment no dizziness reported      Pertinent Vitals/Pain Pain Assessment: 0-10 Faces Pain Scale: Hurts even more Pain Location: abdominal incision Pain Descriptors / Indicators:  Discomfort;Grimacing Pain Intervention(s): Limited activity within patient's tolerance;Monitored during session;Repositioned;Patient requesting pain meds-RN notified;Ice applied (ice pack given to pt)    Home Living                      Prior Function            PT Goals (current goals can now be found in the care plan section) Acute Rehab PT Goals Patient Stated Goal: go home PT Goal Formulation: With patient Time For Goal Achievement: 10/21/20 Potential to Achieve Goals: Good Progress towards PT goals: Progressing toward goals    Frequency    Min 3X/week      PT Plan Equipment recommendations need to be updated    Co-evaluation       OT goals addressed during session: ADL's and self-care      AM-PAC PT "6 Clicks" Mobility   Outcome Measure  Help needed turning from your back to your side while in a flat bed without using bedrails?: None Help needed moving from lying on your back to sitting on the side of a flat bed without using bedrails?: None Help needed moving to and from a bed to a chair (including a wheelchair)?: None Help needed standing up from a chair using your arms (e.g., wheelchair or bedside chair)?: None Help needed to walk in hospital room?: A Little Help needed climbing 3-5 steps with a railing? : None 6 Click Score: 23    End of Session   Activity Tolerance: Patient limited by pain Patient left: in bed;with call bell/phone within reach Nurse Communication: Mobility status;Patient requests pain meds PT Visit Diagnosis: Difficulty in walking, not elsewhere classified (R26.2);Pain;Muscle weakness (generalized) (M62.81)     Time: 6759-1638 PT Time Calculation (min) (ACUTE ONLY): 10 min  Charges:  $Therapeutic Activity: 8-22 mins                     Yuma Blucher P., PTA Acute Rehabilitation Services Pager: [phone] Office: [phone]    Dorathy Kinsman Lilliona Blakeney 10/11/2020, 12:02 PM

## 2020-10-16 ENCOUNTER — Encounter: Payer: Self-pay | Admitting: *Deleted

## 2020-10-16 NOTE — Progress Notes (Signed)
Pt was a walk in to clinic complaining about abdominal pain and trouble sleeping. He is s/p AO bi fem on 10/06/20. His incisions are healing well. His pain is intermittent in the left flank and bilateral groins. He has not had an appetite lately and has not been eating well. He denies pain while eating. His feet are warm and have bilateral DP pulses by doppler. Pt is unable to speak a full sentence without stopping to breathe several times. Dr. Myra Gianotti was consulted. Pt was reassured that the pain he is experiencing is normal. Pt has been directed to the [facility] to evaluate breathing problems.

## 2020-11-03 ENCOUNTER — Other Ambulatory Visit: Payer: Self-pay

## 2020-11-03 DIAGNOSIS — I7409 Other arterial embolism and thrombosis of abdominal aorta: Secondary | ICD-10-CM

## 2020-11-13 NOTE — Progress Notes (Signed)
[facility] PROGRESS NOTE  ASSESSMENT / PLAN: Adrian Washington is a 70 y.o. 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 Doing well. 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 OK to return to usual activity. Follow up with me in 1 year with ABI for surveillance.    SUBJECTIVE: Returns to clinic.  He is feeling excellent.  He has returned to normal activity.  He has no more pain in his legs.  He is walking as far as he likes.  He is very thankful.  OBJECTIVE: BP 110/80 (BP Location: Left Arm, Patient Position: Sitting, Cuff Size: Normal)   Pulse 60   Temp 99 F (37.2 C)   Resp 20   Ht 5\' 5"  (1.651 m)   Wt 104 lb (47.2 kg)   SpO2 98%   BMI 17.31 kg/m   Constitutional: well appearing. no acute distress. Cardiac: RRR. Pulmonary: unlabored Abdomen: soft, nontender. Midline well healed without hernia. Vascular: 2+ femoral pulses  CBC Latest Ref Rng & Units 10/07/2020 10/06/2020 10/06/2020  WBC 4.0 - 10.5 K/uL 11.6(H) 10.7(H) -  Hemoglobin 13.0 - 17.0 g/dL 10.3(L) 10.6(L) 9.2(L)  Hematocrit 39.0 - 52.0 % 30.6(L) 32.9(L) 27.0(L)  Platelets 150 - 400 K/uL 182 194 -     CMP Latest Ref Rng & Units 10/07/2020 10/06/2020 10/06/2020  Glucose 70 - 99 mg/dL 10/08/2020) 616(W) -  BUN 8 - 23 mg/dL 6(L) 7(L) -  Creatinine 0.61 - 1.24 mg/dL 737(T 0.62 -  Sodium 6.94 - 145 mmol/L 132(L) 138 140  Potassium 3.5 - 5.1 mmol/L 4.2 3.4(L) 3.1(L)  Chloride 98 - 111 mmol/L 101 104 -  CO2 22 - 32 mmol/L 25 26 -  Calcium 8.9 - 10.3 mg/dL 8.0(L) 8.1(L) -  Total Protein 6.5 - 8.1 g/dL 6.0(L) - -  Total Bilirubin 0.3 - 1.2 mg/dL 0.5 - -  Alkaline Phos 38 - 126 U/L 63 - -  AST 15 - 41 U/L 23 - -  ALT 0 - 44 U/L 14 - -   Theran Vandergrift N. [id number], MD [facility] Phone Number: [phone] 11/14/2020 1:52 PM

## 2020-11-14 ENCOUNTER — Ambulatory Visit (INDEPENDENT_AMBULATORY_CARE_PROVIDER_SITE_OTHER): Payer: Self-pay | Admitting: Vascular Surgery

## 2020-11-14 ENCOUNTER — Ambulatory Visit (HOSPITAL_COMMUNITY)
Admission: RE | Admit: 2020-11-14 | Discharge: 2020-11-14 | Disposition: A | Discharge status: Home / Self Care | Payer: No Typology Code available for payment source | Source: Ambulatory Visit | Attending: Vascular Surgery | Admitting: Vascular Surgery

## 2020-11-14 ENCOUNTER — Encounter: Payer: Self-pay | Admitting: Vascular Surgery

## 2020-11-14 ENCOUNTER — Other Ambulatory Visit: Payer: Self-pay

## 2020-11-14 VITALS — BP 110/80 | HR 60 | Temp 99.0°F | Resp 20 | Ht 65.0 in | Wt 104.0 lb

## 2020-11-14 DIAGNOSIS — I7409 Other arterial embolism and thrombosis of abdominal aorta: Secondary | ICD-10-CM | POA: Diagnosis not present

## 2020-11-14 DIAGNOSIS — I739 Peripheral vascular disease, unspecified: Secondary | ICD-10-CM

## 2020-12-13 ENCOUNTER — Telehealth: Payer: Self-pay

## 2020-12-13 NOTE — Telephone Encounter (Signed)
Patient calls today to report that while it is getting better, he is having some cramping pain in the left calf after walking about 20 feet. Says it has been ongoing. He has no wounds and no rest pain. Discussed with PA - advised patient to continue walking to help develop collaterals and call sooner rather than later for follow up if he developed rest pain or wounds. Patient verbalizes understanding.

## 2021-10-05 ENCOUNTER — Encounter (HOSPITAL_COMMUNITY): Payer: Self-pay

## 2021-10-05 ENCOUNTER — Emergency Department (HOSPITAL_COMMUNITY): Payer: No Typology Code available for payment source

## 2021-10-05 ENCOUNTER — Emergency Department (HOSPITAL_COMMUNITY)
Admission: EM | Admit: 2021-10-05 | Discharge: 2021-10-05 | Disposition: A | Discharge status: Home / Self Care | Payer: No Typology Code available for payment source | Attending: Emergency Medicine | Admitting: Emergency Medicine

## 2021-10-05 DIAGNOSIS — S161XXA Strain of muscle, fascia and tendon at neck level, initial encounter: Secondary | ICD-10-CM | POA: Diagnosis not present

## 2021-10-05 DIAGNOSIS — R1013 Epigastric pain: Secondary | ICD-10-CM | POA: Insufficient documentation

## 2021-10-05 DIAGNOSIS — M25511 Pain in right shoulder: Secondary | ICD-10-CM | POA: Diagnosis not present

## 2021-10-05 DIAGNOSIS — S20211A Contusion of right front wall of thorax, initial encounter: Secondary | ICD-10-CM | POA: Diagnosis not present

## 2021-10-05 DIAGNOSIS — S20212A Contusion of left front wall of thorax, initial encounter: Secondary | ICD-10-CM | POA: Insufficient documentation

## 2021-10-05 DIAGNOSIS — S20219A Contusion of unspecified front wall of thorax, initial encounter: Secondary | ICD-10-CM

## 2021-10-05 DIAGNOSIS — S199XXA Unspecified injury of neck, initial encounter: Secondary | ICD-10-CM | POA: Diagnosis present

## 2021-10-05 DIAGNOSIS — Y92410 Unspecified street and highway as the place of occurrence of the external cause: Secondary | ICD-10-CM | POA: Diagnosis not present

## 2021-10-05 LAB — CBC WITH DIFFERENTIAL/PLATELET
Abs Immature Granulocytes: 0.02 10*3/uL (ref 0.00–0.07)
Basophils Absolute: 0 10*3/uL (ref 0.0–0.1)
Basophils Relative: 0 %
Eosinophils Absolute: 0.1 10*3/uL (ref 0.0–0.5)
Eosinophils Relative: 1 %
HCT: 41.1 % (ref 39.0–52.0)
Hemoglobin: 13.4 g/dL (ref 13.0–17.0)
Immature Granulocytes: 0 %
Lymphocytes Relative: 24 %
Lymphs Abs: 1.6 10*3/uL (ref 0.7–4.0)
MCH: 31.1 pg (ref 26.0–34.0)
MCHC: 32.6 g/dL (ref 30.0–36.0)
MCV: 95.4 fL (ref 80.0–100.0)
Monocytes Absolute: 0.6 10*3/uL (ref 0.1–1.0)
Monocytes Relative: 9 %
Neutro Abs: 4.3 10*3/uL (ref 1.7–7.7)
Neutrophils Relative %: 66 %
Platelets: 174 10*3/uL (ref 150–400)
RBC: 4.31 MIL/uL (ref 4.22–5.81)
RDW: 14.4 % (ref 11.5–15.5)
WBC: 6.6 10*3/uL (ref 4.0–10.5)
nRBC: 0 % (ref 0.0–0.2)

## 2021-10-05 LAB — URINALYSIS, ROUTINE W REFLEX MICROSCOPIC
Bacteria, UA: NONE SEEN
Bilirubin Urine: NEGATIVE
Glucose, UA: NEGATIVE mg/dL
Hgb urine dipstick: NEGATIVE
Ketones, ur: NEGATIVE mg/dL
Nitrite: NEGATIVE
Protein, ur: NEGATIVE mg/dL
Specific Gravity, Urine: >1.046 — ABNORMAL HIGH (ref 1.005–1.030)
pH: 6 (ref 5.0–8.0)

## 2021-10-05 LAB — COMPREHENSIVE METABOLIC PANEL
ALT: 20 U/L (ref 0–44)
AST: 22 U/L (ref 15–41)
Albumin: 4.2 g/dL (ref 3.5–5.0)
Alkaline Phosphatase: 84 U/L (ref 38–126)
Anion gap: 7 (ref 5–15)
BUN: 15 mg/dL (ref 8–23)
CO2: 28 mmol/L (ref 22–32)
Calcium: 9.6 mg/dL (ref 8.9–10.3)
Chloride: 106 mmol/L (ref 98–111)
Creatinine, Ser: 0.92 mg/dL (ref 0.61–1.24)
GFR, Estimated: >60 mL/min (ref >60)
Glucose, Bld: 102 mg/dL — ABNORMAL HIGH (ref 70–99)
Potassium: 4.1 mmol/L (ref 3.5–5.1)
Sodium: 141 mmol/L (ref 135–145)
Total Bilirubin: 0.4 mg/dL (ref 0.3–1.2)
Total Protein: 7.9 g/dL (ref 6.5–8.1)

## 2021-10-05 LAB — TROPONIN I (HIGH SENSITIVITY): Troponin I (High Sensitivity): 4 ng/L (ref <18)

## 2021-10-05 MED ORDER — ACETAMINOPHEN 500 MG PO TABS: 1000.0000 mg | ORAL_TABLET | Freq: Four times a day (QID) | ORAL | 0 refills | Status: AC | PRN

## 2021-10-05 MED ORDER — IOHEXOL 300 MG/ML  SOLN
100.0000 mL | Freq: Once | INTRAMUSCULAR | Status: AC | PRN
Start: 2021-10-05 — End: 2021-10-05
  Administered 2021-10-05: 100 mL via INTRAVENOUS

## 2021-10-05 MED ORDER — SODIUM CHLORIDE (PF) 0.9 % IJ SOLN
INTRAMUSCULAR | Status: AC
  Filled 2021-10-05: qty 50

## 2021-10-05 MED ORDER — METHOCARBAMOL 500 MG PO TABS: 500.0000 mg | ORAL_TABLET | Freq: Three times a day (TID) | ORAL | 0 refills | Status: AC | PRN
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
## 2021-10-05 NOTE — ED Notes (Signed)
Dr. Pfeiffer at bedside   

## 2021-10-05 NOTE — ED Notes (Signed)
Pt A&Ox4 ambulatory with independent steady gait.

## 2021-10-05 NOTE — ED Notes (Signed)
An After Visit Summary was printed and given to the patient. Discharge instructions given and no further questions at this time.  Pt leaving with family.  
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
## 2021-10-05 NOTE — ED Notes (Signed)
Dr. Donnald Garre at bedside to provide update.
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
## 2021-10-08 ENCOUNTER — Telehealth: Payer: Self-pay

## 2021-10-08 NOTE — Telephone Encounter (Signed)
Patient called stating he was recently in an accident and while in the hospital he spoke to the staff stating he has had worsening LLE pains. They advised pt to reach out to our office to be scheduled sooner than September for yrly f/u. Patient stated the pains were worst prior to the accident but he didn't think anything of. He stated he has noticed his left leg being colder than the right and having worsening pains during the night. Appt scheduled.

## 2021-10-09 ENCOUNTER — Other Ambulatory Visit: Payer: Self-pay | Admitting: *Deleted

## 2021-10-09 DIAGNOSIS — I739 Peripheral vascular disease, unspecified: Secondary | ICD-10-CM

## 2021-10-09 DIAGNOSIS — I7409 Other arterial embolism and thrombosis of abdominal aorta: Secondary | ICD-10-CM

## 2021-10-15 NOTE — Progress Notes (Unsigned)
[facility] [place]  ASSESSMENT / PLAN: Adrian Washington is a 71 y.o. 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  Recommend the following which can slow the progression of atherosclerosis and reduce the risk of major adverse cardiac / limb events:  Complete cessation from all tobacco products. Blood glucose control with goal A1c < 7%. Blood pressure control with goal blood pressure < 140/90 mmHg. Lipid reduction therapy with goal LDL-C <100 mg/dL (<41 if symptomatic from PAD).  Aspirin 81mg  PO QD.  Atorvastatin 40-80mg  PO QD (or other "high intensity" statin therapy).  Recent CT scan personally reviewed. 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 I do not see any signs of pseudoaneurysm on clinical exam or CT scan ***. The left profunda femoris artery appears to have progressed and is now severely stenosed. 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 This is causing *** symptoms.     CHIEF COMPLAINT: bilateral leg pain  HISTORY OF PRESENT ILLNESS: Adrian Washington is a 71 y.o. male referred to clinic for evaluation of bilateral lower extremity pain.  Patient reports he has significant pain in his calves and thighs when he walks.  He also reports some pain at rest in his feet.  Pain at rest is relieved by dependency.  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  09/04/20: patient returns to discuss CTA findings. Our office is not functional at the moment, so we discussed the CT findings face to face.   09/19/20: returns to discuss MR findings. We reviewed risks / benefits / alternatives.  10/16/21:   VASCULAR SURGICAL HISTORY: prior infrainguinal stenting - patient unclear of details  VASCULAR RISK FACTORS: Negative history of cerebrovascular disease / stroke / transient ischemic attack. Negative history of  coronary artery disease. Negative history of diabetes mellitus.  Positive history of smoking. Not actively smoking. Negative history of hypertension.  Negative history of chronic kidney disease. Last GFR > 60 in 10/18/21 system.  Negative history of chronic obstructive pulmonary disease.  Past Medical History:  Diagnosis Date   Depression    Hypercholesteremia    PAD (peripheral artery disease) (HCC)    Substance abuse (HCC)    Tobacco abuse     Past Surgical History:  Procedure Laterality Date   AORTA - BILATERAL FEMORAL ARTERY BYPASS GRAFT Bilateral 10/06/2020   Procedure: AORTA BIFEMORAL BYPASS GRAFT AND  OMENTOPEXY;  Surgeon: 10/08/2020, MD;  Location: [facility] OR;  Service: Vascular;  Laterality: Bilateral;   ELBOW SURGERY Right    SHOULDER SURGERY Left    TRACHEOSTOMY      Family History  Problem Relation Age of Onset   Diabetes Mother    Prostate cancer Father     Social History   Socioeconomic History   Marital status: Single    Spouse name: Not on file   Number of children: Not on file   Years of education: Not on file   Highest education level: Not on file  Occupational History   Not on file  Tobacco Use   Smoking status: Former    Packs/day: 2.00    Types: Cigarettes    Quit date: 07/20/2020    Years since quitting: 1.2   Smokeless tobacco: Never  Vaping Use   Vaping Use: Some days  Substance and Sexual Activity   Alcohol use: Not  Currently    Alcohol/week: 1.0 standard drink of alcohol    Types: 1 Cans of beer per week    Comment: 1 a week during football season   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 30 tablet 0   atorvastatin (LIPITOR) 40 MG tablet Take 40 mg by mouth daily.     docusate sodium (COLACE) 100 MG capsule Take 2 capsules (200 mg total) by mouth 2 (two) times daily. Continue to take while taking Norco to help with constipation. (Patient not taking: Reported on 10/05/2021) 10 capsule 0   HYDROcodone-acetaminophen (NORCO/VICODIN) 5-325 MG tablet Take 1 tablet by mouth every 6 (six) hours as needed for moderate pain. (Patient not taking: Reported on 10/05/2021) 30 tablet 0   methocarbamol (ROBAXIN) 500 MG tablet Take 1 tablet (500 mg total) by mouth every 8 (eight) hours as needed for muscle spasms. 20 tablet 0   traZODone (DESYREL) 100 MG tablet Take 100 mg by mouth at bedtime.     No current facility-administered medications for this visit.    REVIEW OF SYSTEMS:  [X]  denotes positive finding, [ ]  denotes negative finding Cardiac  Comments:  Chest pain or chest pressure:    Shortness of breath upon exertion:    Short of breath when lying flat:    Irregular heart rhythm:        Vascular    Pain in calf, thigh, or hip brought on by ambulation: x   Pain in feet at night that wakes you up from your sleep:  x   Blood clot in your veins:    Leg swelling:         Pulmonary    Oxygen at home:    Productive cough:     Wheezing:         Neurologic    Sudden weakness in arms or legs:     Sudden numbness in arms or legs:     Sudden onset of difficulty speaking or slurred speech:    Temporary loss of vision in one eye:     Problems with dizziness:         Gastrointestinal    Blood in stool:     Vomited blood:         Genitourinary    Burning when urinating:     Blood in urine:        Psychiatric    Major depression:          Hematologic    Bleeding problems:    Problems with blood clotting too easily:        Skin    Rashes or ulcers:        Constitutional    Fever or chills:      PHYSICAL EXAM Not performed today.  Office not functional.  PERTINENT LABORATORY AND  RADIOLOGIC DATA ABI 08/22/20   CLINICAL DATA:  Claudication   Peripheral arterial disease   EXAM: CT ANGIOGRAPHY OF ABDOMINAL AORTA WITH ILIOFEMORAL RUNOFF   TECHNIQUE: Multidetector CT imaging of the abdomen, pelvis and lower extremities was performed using the standard protocol during bolus administration of intravenous contrast. Multiplanar CT image reconstructions and MIPs were obtained to evaluate the vascular anatomy.   CONTRAST:  34mL OMNIPAQUE IOHEXOL 350 MG/ML SOLN   COMPARISON:  None.   FINDINGS: VASCULAR   Aorta: Distal infrarenal abdominal aorta is occluded.  No aneurysm.   Celiac: No significant abnormality.  No significant stenosis.   SMA: No significant stenosis.  No signal abnormality.   Renals: Both renal arteries are patent without evidence of aneurysm, dissection, vasculitis, fibromuscular dysplasia or significant stenosis.   IMA: Severe stenosis at the origin due to calcified plaque. Otherwise patent.   RIGHT Lower Extremity   Inflow: Common and external iliac arteries are occluded. Proximal internal iliac artery is occluded. There is opacification of distal branches consistent with retrograde collateral flow.   Outflow: Flow reconstitutes within a heavily calcified and narrowed common femoral artery. Near complete occlusion at the origin of the profundus femoris and superficial femoral arteries. Both superficial femoral and profunda femoris arteries are densely calcified in their proximal segments. The Profunda femoris artery is patent. The superficial femoral artery is densely calcified with multiple foci of complete to near complete occlusion in its proximal and mid 1/3. The distal 1/3  appears occluded. Right popliteal artery predominately appears occluded. There is a thin opacifying channel just above the tibioperoneal trunk.   Runoff: Tibioperoneal trunk is diffusely narrowed with dense atheromatous plaque. The posterior tibial artery is occluded. The anterior tibial artery is diffusely calcified and narrowed but is likely patent. The peroneal artery is patent.   LEFT Lower Extremity   Inflow: Common and external iliac arteries are occluded. External iliac artery stent is noted. Proximal internal iliac artery is occluded. Opacification of distal internal iliac artery branches consistent with retrograde collateral flow.   Outflow: Flow reconstitutes within a heavily calcified and diffusely narrowed common femoral artery. Near complete occlusion at the origin of the Profunda femoris artery which is patent. Left superficial femoral artery is densely calcified and predominantly occluded. Flow reconstitutes through prominent muscular collaterals from the Profunda femoris artery at the level of the popliteal artery. The popliteal artery is diffusely calcified and severely narrowed, but remains patent.   Runoff: Severe stenosis of the tibioperoneal trunk due to bulky calcified atheromatous plaque. There is 2 vessel runoff to the left ankle through the posterior tibial and peroneal arteries. The anterior tibial artery is occluded. The peroneal artery is diffusely calcified and narrowed.   Veins: No obvious venous abnormality within the limitations of this arterial phase study.   Review of the MIP images confirms the above findings.   NON-VASCULAR   Lower chest: No acute abnormality.   Hepatobiliary: 1.4 cm subtly enhancing lesion seen in the posterior right hepatic lobe (image 43, series 4). Gallbladder not well evaluated due to collapse configuration.   Pancreas: Unremarkable. No pancreatic ductal dilatation or surrounding inflammatory changes.   Spleen:  Normal in size without focal abnormality.   Adrenals/Urinary Tract: Adrenal glands are unremarkable. Subcentimeter right renal hypodensity is too small to fully characterize. Kidneys in ureters otherwise unremarkable. No significant abnormality of the bladder.   Stomach/Bowel: No bowel dilatation to indicate ileus or obstruction.   Lymphatic: No enlarged abdominal or pelvic lymph nodes.   Reproductive:  Large right hydrocele.  Prostate is unremarkable.   Other: No abdominal wall hernia or abnormality. No abdominopelvic ascites.   Musculoskeletal: Remote healed fractures noted at the posterior segments of multiple right lower ribs. 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   IMPRESSION: VASCULAR   1. Occluded distal infrarenal abdominal aorta, bilateral common iliac, external iliac, and proximal internal iliac arteries. Given well-established collateral arteries in the anterior abdominal wall and perirectal region findings are consistent with chronic occlusion. 2. Occluded left superficial femoral artery. Severe stenosis of the left common femoral artery and origin of left Profunda femoris artery. Diffuse severe stenosis of the left popliteal artery and tibioperoneal trunk. Two vessel runoff to the left ankle through the posterior tibial and peroneal arteries. 3. Severely narrowed right common femoral artery. Severe stenosis at the origin of the right profunda femoris artery. Severe long segment stenosis with multiple foci of near complete occlusion of the right superficial femoral artery in the proximal and mid 1/3. The distal 1/3 of the SFA appears occluded. Right popliteal artery is occluded in its proximal segment with thin patent channel in the distal most segment. Right tibioperoneal trunk is diffusely narrowed. Two vessel runoff to the right ankle through the anterior tibial and peroneal arteries.   NON-VASCULAR   1.4 cm  subtly enhancing lesion in the posterior right hepatic lobe (43/4) requires further evaluation with contrast enhanced abdominal MRI.     Electronically Signed   By: Acquanetta Belling M.D.   On: 09/01/2020 09:49  CLINICAL DATA:  Indeterminate liver lesion seen on recent abdomen CTA.   EXAM: MRI ABDOMEN WITHOUT AND WITH CONTRAST   TECHNIQUE: Multiplanar multisequence MR imaging of the abdomen was performed both before and after the administration of intravenous contrast.   CONTRAST:  60mL MULTIHANCE GADOBENATE DIMEGLUMINE 529 MG/ML IV SOLN   COMPARISON:  Abdomen CTA on 08/31/2020   FINDINGS: Lower chest: No acute findings.   Hepatobiliary: A 1.3 cm lesion is seen in the posterior right hepatic lobe (image 52/13), which shows arterial phase hyperenhancement but isointensity on portal venous and delayed phase imaging. This lesion also shows T2 isointensity and lack of restricted diffusion, consistent with benign focal nodular hyperplasia. No other liver lesions are identified. Gallbladder is unremarkable. No evidence of biliary ductal dilatation.   Pancreas:  No mass or inflammatory changes.   Spleen:  Within normal limits in size and appearance.   Adrenals/Urinary Tract: No masses identified. Tiny right renal cyst noted. No evidence of hydronephrosis.   Stomach/Bowel: Visualized portion unremarkable.   Vascular/Lymphatic: No pathologically enlarged lymph nodes identified. Chronic occlusion of distal abdominal aorta again seen, as better demonstrated on recent CTA.   Other:  None.   Musculoskeletal:  No suspicious bone lesions identified.   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 No evidence of malignancy.     Electronically Signed   By: Danae Orleans M.D.   On: 09/15/2020 20:28     Rande Brunt. Lenell Antu, MD [facility] Phone Number:  [phone] 10/15/2021 2:38 PM

## 2021-10-16 ENCOUNTER — Encounter: Payer: Self-pay | Admitting: Vascular Surgery

## 2021-10-16 ENCOUNTER — Ambulatory Visit (INDEPENDENT_AMBULATORY_CARE_PROVIDER_SITE_OTHER): Payer: No Typology Code available for payment source | Admitting: Vascular Surgery

## 2021-10-16 ENCOUNTER — Ambulatory Visit (HOSPITAL_COMMUNITY)
Admission: RE | Admit: 2021-10-16 | Discharge: 2021-10-16 | Disposition: A | Discharge status: Home / Self Care | Payer: No Typology Code available for payment source | Source: Ambulatory Visit | Attending: Vascular Surgery | Admitting: Vascular Surgery

## 2021-10-16 VITALS — BP 117/70 | HR 68 | Temp 97.9°F | Resp 20 | Ht 65.0 in | Wt 116.2 lb

## 2021-10-16 DIAGNOSIS — I7409 Other arterial embolism and thrombosis of abdominal aorta: Secondary | ICD-10-CM | POA: Diagnosis present

## 2021-10-16 DIAGNOSIS — I739 Peripheral vascular disease, unspecified: Secondary | ICD-10-CM

## 2021-10-18 ENCOUNTER — Telehealth: Payer: Self-pay

## 2021-10-18 NOTE — Telephone Encounter (Signed)
Attempted to schedule patient for left femoral endarterectomy on 9/03/27/21 with Dr. Lenell Antu as discussed at office visit. Patient's sister, Adrian Washington had several questions regarding surgery and requested information to be emailed to her if possible explaining the surgery and then patient will make a decision to schedule or not. Emailed the general clinical reference for femoral endarterectomy as requested.

## 2021-10-24 ENCOUNTER — Other Ambulatory Visit: Payer: Self-pay

## 2021-10-24 DIAGNOSIS — I739 Peripheral vascular disease, unspecified: Secondary | ICD-10-CM

## 2021-11-06 NOTE — Telephone Encounter (Signed)
Patient contacted office requesting to to postpone surgery until after Oct 16. Surgery rescheduled for 12/19/21. Instructions reviewed- patient verbalized understanding.

## 2021-11-08 ENCOUNTER — Other Ambulatory Visit (HOSPITAL_COMMUNITY): Payer: No Typology Code available for payment source

## 2021-12-10 NOTE — Pre-Procedure Instructions (Signed)
Surgical Instructions    Your procedure is scheduled on Wednesday November 1.  Report to [facility] Main Entrance "A" at 6:30 A.M., then check in with the Admitting office.  Call this number if you have problems the morning of surgery:  [phone]  If you have any questions prior to your surgery date call [phone]: Open Monday-Friday 8am-4pm  If you experience any cold or flu symptoms such as cough, fever, chills, shortness of breath, etc. between now and your scheduled surgery, please notify us at the above number     Remember:  Do not eat or drink after midnight the night before your surgery    Take these medicines the morning of surgery with A SIP OF WATER:  atorvastatin (LIPITOR)  IF NEEDED: acetaminophen (TYLENOL) methocarbamol (ROBAXIN)   As of today, STOP taking any Aspirin (unless otherwise instructed by your surgeon) Aleve, Naproxen, Ibuprofen, Motrin, Advil, Goody's, BC's, all herbal medications, fish oil, and all vitamins.   Do NOT Smoke (Tobacco/Vaping)  24 hours prior to your procedure  If you use a CPAP at night, you may bring your mask for your overnight stay.   Contacts, glasses, hearing aids, dentures or partials may not be worn into surgery, please bring cases for these belongings   For patients admitted to the hospital, discharge time will be determined by your treatment team.   Patients discharged the day of surgery will not be allowed to drive home, and someone needs to stay with them for 24 hours.   SURGICAL WAITING ROOM VISITATION Patients having surgery or a procedure may have no more than 2 support people in the waiting area - these visitors may rotate.   Children under the age of 5 must have an adult with them who is not the patient. If the patient needs to stay at the hospital during part of their recovery, the visitor guidelines for inpatient rooms apply. Pre-op nurse will coordinate an appropriate time for 1 support person to accompany  patient in pre-op.  This support person may not rotate.   Please refer to the [facility] website for the visitor guidelines for Inpatients (after your surgery is over and you are in a regular room).    Special instructions:    Oral Hygiene is also important to reduce your risk of infection.  Remember - BRUSH YOUR TEETH THE MORNING OF SURGERY WITH YOUR REGULAR TOOTHPASTE   [place]- Preparing For Surgery  Before surgery, you can play an important role. Because skin is not sterile, your skin needs to be as free of germs as possible. You can reduce the number of germs on your skin by washing with CHG (chlorahexidine gluconate) Soap before surgery.  CHG is an antiseptic cleaner which kills germs and bonds with the skin to continue killing germs even after washing.     Please do not use if you have an allergy to CHG or antibacterial soaps. If your skin becomes reddened/irritated stop using the CHG.  Do not shave (including legs and underarms) for at least 48 hours prior to first CHG shower. It is OK to shave your face.  Please follow these instructions carefully.     Shower the NIGHT BEFORE SURGERY and the MORNING OF SURGERY with CHG Soap.   If you chose to wash your hair, wash your hair first as usual with your normal shampoo. After you shampoo, rinse your hair and body thoroughly to remove the shampoo.  Then [employer] and genitals (private parts) with your normal  soap and rinse thoroughly to remove soap.  After that Use CHG Soap as you would any other liquid soap. You can apply CHG directly to the skin and wash gently with a scrungie or a clean washcloth.   Apply the CHG Soap to your body ONLY FROM THE NECK DOWN.  Do not use on open wounds or open sores. Avoid contact with your eyes, ears, mouth and genitals (private parts). Wash Face and genitals (private parts)  with your normal soap.   Wash thoroughly, paying special attention to the area where your surgery will be  performed.  Thoroughly rinse your body with warm water from the neck down.  DO NOT shower/wash with your normal soap after using and rinsing off the CHG Soap.  Pat yourself dry with a CLEAN TOWEL.  Wear CLEAN PAJAMAS to bed the night before surgery  Place CLEAN SHEETS on your bed the night before your surgery  DO NOT SLEEP WITH PETS.   Day of Surgery:  Take a shower with CHG soap. Wear Clean/Comfortable clothing the morning of surgery Remember to brush your teeth WITH YOUR REGULAR TOOTHPASTE. Do not wear jewelry or makeup. Do not wear lotions, powders, cologne or deodorant. Men may shave face and neck. Do not bring valuables to the hospital.  [facility] is not responsible for any belongings or valuables.    If you received a COVID test during your pre-op visit, it is requested that you wear a mask when out in public, stay away from anyone that may not be feeling well, and notify your surgeon if you develop symptoms. If you have been in contact with anyone that has tested positive in the last 10 days, please notify your surgeon.    Please read over the following fact sheets that you were given.

## 2021-12-11 ENCOUNTER — Encounter (HOSPITAL_COMMUNITY)
Admission: RE | Admit: 2021-12-11 | Discharge: 2021-12-11 | Disposition: A | Discharge status: Home / Self Care | Payer: No Typology Code available for payment source | Source: Ambulatory Visit | Attending: Vascular Surgery | Admitting: Vascular Surgery

## 2021-12-11 ENCOUNTER — Other Ambulatory Visit: Payer: Self-pay

## 2021-12-11 ENCOUNTER — Encounter (HOSPITAL_COMMUNITY): Payer: Self-pay

## 2021-12-11 VITALS — BP 104/61 | HR 71 | Temp 97.8°F | Resp 18 | Ht 65.0 in | Wt 116.7 lb

## 2021-12-11 DIAGNOSIS — Q251 Coarctation of aorta: Secondary | ICD-10-CM | POA: Diagnosis not present

## 2021-12-11 DIAGNOSIS — Z01812 Encounter for preprocedural laboratory examination: Secondary | ICD-10-CM | POA: Diagnosis present

## 2021-12-11 DIAGNOSIS — I739 Peripheral vascular disease, unspecified: Secondary | ICD-10-CM | POA: Diagnosis not present

## 2021-12-11 HISTORY — DX: Unspecified osteoarthritis, unspecified site: M19.90

## 2021-12-11 LAB — COMPREHENSIVE METABOLIC PANEL
ALT: 24 U/L (ref 0–44)
AST: 25 U/L (ref 15–41)
Albumin: 3.9 g/dL (ref 3.5–5.0)
Alkaline Phosphatase: 106 U/L (ref 38–126)
Anion gap: 11 (ref 5–15)
BUN: 11 mg/dL (ref 8–23)
CO2: 24 mmol/L (ref 22–32)
Calcium: 9.5 mg/dL (ref 8.9–10.3)
Chloride: 107 mmol/L (ref 98–111)
Creatinine, Ser: 0.83 mg/dL (ref 0.61–1.24)
GFR, Estimated: >60 mL/min (ref >60)
Glucose, Bld: 95 mg/dL (ref 70–99)
Potassium: 4.1 mmol/L (ref 3.5–5.1)
Sodium: 142 mmol/L (ref 135–145)
Total Bilirubin: 0.6 mg/dL (ref 0.3–1.2)
Total Protein: 7.6 g/dL (ref 6.5–8.1)

## 2021-12-11 LAB — URINALYSIS, ROUTINE W REFLEX MICROSCOPIC
Bacteria, UA: NONE SEEN
Bilirubin Urine: NEGATIVE
Glucose, UA: NEGATIVE mg/dL
Ketones, ur: NEGATIVE mg/dL
Leukocytes,Ua: NEGATIVE
Nitrite: NEGATIVE
Protein, ur: NEGATIVE mg/dL
Specific Gravity, Urine: 1.02 (ref 1.005–1.030)
pH: 5 (ref 5.0–8.0)

## 2021-12-11 LAB — PROTIME-INR
INR: 1 (ref 0.8–1.2)
Prothrombin Time: 13.2 seconds (ref 11.4–15.2)

## 2021-12-11 LAB — CBC
HCT: 39.8 % (ref 39.0–52.0)
Hemoglobin: 12.8 g/dL — ABNORMAL LOW (ref 13.0–17.0)
MCH: 30.8 pg (ref 26.0–34.0)
MCHC: 32.2 g/dL (ref 30.0–36.0)
MCV: 95.9 fL (ref 80.0–100.0)
Platelets: 193 10*3/uL (ref 150–400)
RBC: 4.15 MIL/uL — ABNORMAL LOW (ref 4.22–5.81)
RDW: 14.5 % (ref 11.5–15.5)
WBC: 5 10*3/uL (ref 4.0–10.5)
nRBC: 0 % (ref 0.0–0.2)

## 2021-12-11 LAB — TYPE AND SCREEN
ABO/RH(D): A POS
Antibody Screen: NEGATIVE

## 2021-12-11 LAB — APTT: aPTT: 28 seconds (ref 24–36)

## 2021-12-11 LAB — SURGICAL PCR SCREEN
MRSA, PCR: NEGATIVE
Staphylococcus aureus: NEGATIVE

## 2021-12-11 NOTE — Progress Notes (Signed)
[facility] - [facility] Cardiologist - denies  PPM/ICD - denies  Chest x-ray - denies EKG - 10/05/21 Stress Test - 09/25/20 ECHO - 09/20/20 Cardiac Cath - denies  Sleep Study - denies CPAP - n/a  No diabetes.  As of today, STOP taking any Aspirin (unless otherwise instructed by your surgeon) Aleve, Naproxen, Ibuprofen, Motrin, Advil, Goody's, BC's, all herbal medications, fish oil, and all vitamins.  NPA  COVID TEST- no   Anesthesia review: no  Patient denies shortness of breath, fever, cough and chest pain at PAT appointment   All instructions explained to the patient, with a verbal understanding of the material. Patient agrees to go over the instructions while at home for a better understanding. Patient also instructed to self quarantine after being tested for COVID-19. The opportunity to ask questions was provided.

## 2021-12-17 ENCOUNTER — Other Ambulatory Visit: Payer: Self-pay

## 2021-12-17 DIAGNOSIS — I739 Peripheral vascular disease, unspecified: Secondary | ICD-10-CM

## 2021-12-19 ENCOUNTER — Ambulatory Visit (HOSPITAL_BASED_OUTPATIENT_CLINIC_OR_DEPARTMENT_OTHER): Payer: No Typology Code available for payment source

## 2021-12-19 ENCOUNTER — Inpatient Hospital Stay (HOSPITAL_COMMUNITY)
Admission: RE | Admit: 2021-12-19 | Payer: No Typology Code available for payment source | Source: Home / Self Care | Admitting: Vascular Surgery

## 2021-12-19 ENCOUNTER — Other Ambulatory Visit: Payer: Self-pay

## 2021-12-19 ENCOUNTER — Encounter (HOSPITAL_COMMUNITY)
Admission: RE | Disposition: A | Discharge status: Home / Self Care | Payer: Self-pay | Source: Home / Self Care | Attending: Vascular Surgery

## 2021-12-19 ENCOUNTER — Encounter (HOSPITAL_COMMUNITY): Payer: Self-pay | Admitting: Vascular Surgery

## 2021-12-19 ENCOUNTER — Encounter (HOSPITAL_COMMUNITY): Admission: RE | Payer: Self-pay | Source: Home / Self Care

## 2021-12-19 ENCOUNTER — Ambulatory Visit (HOSPITAL_COMMUNITY)
Admission: RE | Admit: 2021-12-19 | Discharge: 2021-12-19 | Disposition: A | Discharge status: Home / Self Care | Payer: No Typology Code available for payment source | Attending: Vascular Surgery | Admitting: Vascular Surgery

## 2021-12-19 DIAGNOSIS — Z87891 Personal history of nicotine dependence: Secondary | ICD-10-CM | POA: Insufficient documentation

## 2021-12-19 DIAGNOSIS — I70222 Atherosclerosis of native arteries of extremities with rest pain, left leg: Secondary | ICD-10-CM | POA: Insufficient documentation

## 2021-12-19 DIAGNOSIS — I998 Other disorder of circulatory system: Secondary | ICD-10-CM | POA: Diagnosis not present

## 2021-12-19 HISTORY — PX: ABDOMINAL AORTOGRAM W/LOWER EXTREMITY: CATH118223

## 2021-12-19 SURGERY — ENDARTERECTOMY, FEMORAL
Anesthesia: General | Laterality: Left

## 2021-12-19 SURGERY — ABDOMINAL AORTOGRAM W/LOWER EXTREMITY
Anesthesia: LOCAL

## 2021-12-19 MED ORDER — HEPARIN (PORCINE) IN NACL 1000-0.9 UT/500ML-% IV SOLN
INTRAVENOUS | Status: DC | PRN
  Administered 2021-12-19 (×2): 500 mL via INTRA_ARTERIAL

## 2021-12-19 MED ORDER — FENTANYL CITRATE (PF) 100 MCG/2ML IJ SOLN
INTRAMUSCULAR | Status: DC | PRN
  Administered 2021-12-19: 50 ug via INTRAVENOUS

## 2021-12-19 MED ORDER — ACETAMINOPHEN 325 MG PO TABS: 650.0000 mg | ORAL_TABLET | ORAL | Status: DC | PRN

## 2021-12-19 MED ORDER — SODIUM CHLORIDE 0.9 % IV SOLN: INTRAVENOUS | Status: DC

## 2021-12-19 MED ORDER — SODIUM CHLORIDE 0.9 % WEIGHT BASED INFUSION: 1.0000 mL/kg/h | INTRAVENOUS | Status: DC

## 2021-12-19 MED ORDER — SODIUM CHLORIDE 0.9 % IV SOLN: 250.0000 mL | INTRAVENOUS | Status: DC | PRN

## 2021-12-19 MED ORDER — SODIUM CHLORIDE 0.9% FLUSH: 3.0000 mL | INTRAVENOUS | Status: DC | PRN

## 2021-12-19 MED ORDER — SODIUM CHLORIDE 0.9% FLUSH: 3.0000 mL | Freq: Two times a day (BID) | INTRAVENOUS | Status: DC

## 2021-12-19 MED ORDER — LIDOCAINE HCL (PF) 1 % IJ SOLN
INTRAMUSCULAR | Status: AC
  Filled 2021-12-19: qty 30

## 2021-12-19 MED ORDER — HYDRALAZINE HCL 20 MG/ML IJ SOLN: 5.0000 mg | INTRAMUSCULAR | Status: DC | PRN

## 2021-12-19 MED ORDER — MIDAZOLAM HCL 2 MG/2ML IJ SOLN
INTRAMUSCULAR | Status: DC | PRN
  Administered 2021-12-19: 1 mg via INTRAVENOUS

## 2021-12-19 MED ORDER — IODIXANOL 320 MG/ML IV SOLN
INTRAVENOUS | Status: DC | PRN
  Administered 2021-12-19: 55 mL via INTRA_ARTERIAL

## 2021-12-19 MED ORDER — HEPARIN (PORCINE) IN NACL 1000-0.9 UT/500ML-% IV SOLN
INTRAVENOUS | Status: AC
  Filled 2021-12-19: qty 1000

## 2021-12-19 MED ORDER — LABETALOL HCL 5 MG/ML IV SOLN: 10.0000 mg | INTRAVENOUS | Status: DC | PRN

## 2021-12-19 MED ORDER — FENTANYL CITRATE (PF) 100 MCG/2ML IJ SOLN
INTRAMUSCULAR | Status: AC
  Filled 2021-12-19: qty 2

## 2021-12-19 MED ORDER — MIDAZOLAM HCL 2 MG/2ML IJ SOLN
INTRAMUSCULAR | Status: AC
  Filled 2021-12-19: qty 2

## 2021-12-19 MED ORDER — ONDANSETRON HCL 4 MG/2ML IJ SOLN: 4.0000 mg | Freq: Four times a day (QID) | INTRAMUSCULAR | Status: DC | PRN

## 2021-12-19 MED ORDER — LIDOCAINE HCL (PF) 1 % IJ SOLN
INTRAMUSCULAR | Status: DC | PRN
  Administered 2021-12-19: 5 mL via SUBCUTANEOUS

## 2021-12-19 MED ORDER — ASPIRIN 81 MG PO TBEC: 81.0000 mg | DELAYED_RELEASE_TABLET | Freq: Every day | ORAL | 2 refills | Status: AC

## 2021-12-19 SURGICAL SUPPLY — 14 items
BAG SNAP BAND KOVER 36X36 (MISCELLANEOUS) IMPLANT
CATH NAVICROSS ST 65CM (CATHETERS) IMPLANT
CATH OMNI FLUSH 5F 65CM (CATHETERS) IMPLANT
CATHETER NAVICROSS ST 65CM (CATHETERS) ×1
DEVICE CLOSURE MYNXGRIP 5F (Vascular Products) IMPLANT
GLIDEWIRE ADV .035X260CM (WIRE) IMPLANT
KIT MICROPUNCTURE NIT STIFF (SHEATH) IMPLANT
KIT PV (KITS) ×1 IMPLANT
SHEATH PINNACLE 5F 10CM (SHEATH) IMPLANT
SHEATH PROBE COVER 6X72 (BAG) IMPLANT
SYR MEDRAD MARK V 150ML (SYRINGE) IMPLANT
TRANSDUCER W/STOPCOCK (MISCELLANEOUS) ×1 IMPLANT
TRAY PV CATH (CUSTOM PROCEDURE TRAY) ×1 IMPLANT
WIRE BENTSON .035X145CM (WIRE) IMPLANT

## 2021-12-19 NOTE — Progress Notes (Signed)
Lower extremity vein mapping has been completed.   Preliminary results in CV Proc.   Adrian Washington 12/19/2021 12:09 PM

## 2021-12-19 NOTE — Op Note (Signed)
    Patient name: Adrian Washington MRN: [id number] DOB: 1950-07-30 Sex: male  12/19/2021 Pre-operative Diagnosis: Left lower extremity critical limb ischemia with rest pain Post-operative diagnosis:  Same Surgeon:  Broadus John, MD Procedure Performed: 1.  Ultrasound-guided micropuncture access of the right sided aortobifemoral limb 2.  Aortogram 3.  Second-order cannulation, left lower extremity angiogram 4.  Device assisted closure-Mynx 5.  Moderate sedation time 32 minutes 6.  Contrast volume 55 mL   Indications: Patient is a 71 year old male with history of aortobifemoral bypass who presented with progressive claudication in the left lower extremity.  Now, the cramping affects him at night.  After discussing the risk and benefits of left lower extremity angiography in an effort to define and possibly treat areas of stenosis-occlusion, [name] elected to proceed.  Findings:   Aortogram: Aortobifemoral bypass widely patent, no retrograde flow appreciated in bilateral native iliac arteries.  Hypogastric arteries did not opacify, however this could be due to timing.  On the left: Widely patent common femoral artery anastomosis.  Ostial occlusion of the profunda with distal filling of the profunda collaterals. The superficial femoral artery is occluded in its entirety.  The left circumflex iliac artery is widely patent, and is likely keeping the left aorto bypass limb patent.  Distally, there is reconstitution of the popliteal artery at the P2 segment with single-vessel peroneal runoff to the foot.  No named vessels in the foot.   Procedure:  The patient was identified in the holding area and taken to room 8.  The patient was then placed supine on the table and prepped and draped in the usual sterile fashion.  A time out was called.  Ultrasound was used to evaluate the right common femoral artery and associated bypass.  It was patent .  A digital ultrasound image was acquired.  A micropuncture needle  was used to access the aortobifemoral bypass limb at the femoral artery under ultrasound guidance.  An 018 wire was advanced without resistance and a micropuncture sheath was placed.  The 018 wire was removed and a benson wire was placed.  The micropuncture sheath was exchanged for a 5 french sheath.  An omniflush catheter was advanced over the wire to the level of L-1.  An abdominal angiogram was obtained.  Next, using the omniflush catheter and a benson wire, the aortic bifurcation was crossed and the catheter was placed into theleft external iliac artery and left runoff was obtained.  See findings above.  Access was managed with a minx device without issue.   Cassandria Santee, MD [facility] [place] Office: [phone]
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
## 2022-01-01 ENCOUNTER — Ambulatory Visit: Payer: No Typology Code available for payment source | Admitting: Vascular Surgery
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
## 2022-01-15 ENCOUNTER — Encounter: Payer: Self-pay | Admitting: Vascular Surgery

## 2022-01-15 ENCOUNTER — Ambulatory Visit (INDEPENDENT_AMBULATORY_CARE_PROVIDER_SITE_OTHER): Payer: No Typology Code available for payment source | Admitting: Vascular Surgery

## 2022-01-15 VITALS — BP 123/68 | HR 73 | Temp 98.8°F | Resp 20 | Ht 65.0 in | Wt 118.0 lb

## 2022-01-15 DIAGNOSIS — I739 Peripheral vascular disease, unspecified: Secondary | ICD-10-CM

## 2022-01-23 ENCOUNTER — Other Ambulatory Visit: Payer: Self-pay

## 2022-01-23 DIAGNOSIS — I739 Peripheral vascular disease, unspecified: Secondary | ICD-10-CM

## 2022-04-16 ENCOUNTER — Ambulatory Visit (HOSPITAL_COMMUNITY): Payer: No Typology Code available for payment source | Attending: Vascular Surgery

## 2022-04-16 ENCOUNTER — Encounter (HOSPITAL_COMMUNITY): Payer: Self-pay

## 2022-04-16 ENCOUNTER — Ambulatory Visit: Payer: No Typology Code available for payment source | Admitting: Vascular Surgery

## 2022-05-20 ENCOUNTER — Telehealth: Payer: Self-pay
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
## 2022-05-20 NOTE — Telephone Encounter (Signed)
error 

## 2022-05-21 ENCOUNTER — Ambulatory Visit: Payer: No Typology Code available for payment source | Admitting: Vascular Surgery

## 2022-05-21 ENCOUNTER — Encounter (HOSPITAL_COMMUNITY): Payer: Self-pay

## 2022-05-21 ENCOUNTER — Ambulatory Visit (HOSPITAL_COMMUNITY): Payer: No Typology Code available for payment source | Attending: Vascular Surgery

## 2022-05-24 ENCOUNTER — Ambulatory Visit (INDEPENDENT_AMBULATORY_CARE_PROVIDER_SITE_OTHER): Payer: No Typology Code available for payment source | Admitting: Orthopaedic Surgery

## 2022-05-24 DIAGNOSIS — S83241A Other tear of medial meniscus, current injury, right knee, initial encounter: Secondary | ICD-10-CM

## 2022-05-24 NOTE — Progress Notes (Signed)
Office Visit Note   Patient: Adrian Washington           Date of Birth: October 16, 1950           MRN: [id number] Visit Date: 05/24/2022              [facility] [address] [facility] [address] [place],  [place] [id number] PCP: [facility]   Assessment & Plan: Visit Diagnoses:  1. Acute medial meniscus tear of right knee, initial encounter     Plan: MRI from [facility] shows medial meniscal tear.  Minimal chondromalacia.  Treatment options were explained.  Patient states that he cannot afford to be out of work for surgery therefore he will try to just live with it for now.  Follow-up as needed.  Follow-Up Instructions: No follow-ups on file.   Orders:  No orders of the defined types were placed in this encounter.  No orders of the defined types were placed in this encounter.     Procedures: No procedures performed   Clinical Data: No additional findings.   Subjective: Chief Complaint  Patient presents with   Right Knee - Follow-up    HPI  Adrian Washington is a 72 year old gentleman here for evaluation of right knee pain.  Recently had MRI [facility] which showed a small medial meniscal tear.  He reports mechanical symptoms.  He reports that the pain has gotten progressively worse.  He does not recall a specific injury or trauma but more of a cumulative wear and tear to the knee.  Review of Systems  Constitutional: Negative.   HENT: Negative.    Eyes: Negative.   Respiratory: Negative.    Cardiovascular: Negative.   Gastrointestinal: Negative.   Endocrine: Negative.   Genitourinary: Negative.   Skin: Negative.   Allergic/Immunologic: Negative.   Neurological: Negative.   Hematological: Negative.   Psychiatric/Behavioral: Negative.    All other systems reviewed and are negative.    Objective: Vital Signs: There were no vitals taken for this visit.  Physical Exam Vitals and nursing note reviewed.  Constitutional:       Appearance: He is well-developed.  HENT:     Head: Normocephalic and atraumatic.  Eyes:     Pupils: Pupils are equal, round, and reactive to light.  Pulmonary:     Effort: Pulmonary effort is normal.  Abdominal:     Palpations: Abdomen is soft.  Musculoskeletal:        General: Normal range of motion.     Cervical back: Neck supple.  Skin:    General: Skin is warm.  Neurological:     Mental Status: He is alert and oriented to person, place, and time.  Psychiatric:        Behavior: Behavior normal.        Thought Content: Thought content normal.        Judgment: Judgment normal.     Ortho Exam  Examination right knee shows no effusion.  Medial joint line tenderness.  Pain with McMurray's maneuver.  Collaterals and cruciates are stable.  Specialty Comments:  No specialty comments available.  Imaging: No results found.   PMFS History: Patient Active Problem List   Diagnosis Date Noted   Abdominal aortic stenosis 10/06/2020   Acute medial meniscus tear of right knee 02/04/2017   Past Medical History:  Diagnosis Date   Arthritis    Depression    Hypercholesteremia    PAD (peripheral artery disease) ([facility])    Substance abuse (  [facility])    Tobacco abuse     Family History  Problem Relation Age of Onset   Diabetes Mother    Prostate cancer Father     Past Surgical History:  Procedure Laterality Date   ABDOMINAL AORTOGRAM W/LOWER EXTREMITY N/A 12/19/2021   Procedure: ABDOMINAL AORTOGRAM W/LOWER EXTREMITY;  Surgeon: Victorino Sparrow, MD;  Location: [facility] INV[facility];  Service: Cardiovascular;  Laterality: N/A;   AORTA - BILATERAL FEMORAL ARTERY BYPASS GRAFT Bilateral 10/06/2020   Procedure: AORTA BIFEMORAL BYPASS GRAFT AND  OMENTOPEXY;  Surgeon: Leonie Douglas, MD;  Location: [facility];  Service: Vascular;  Laterality: Bilateral;   ELBOW SURGERY Right    SHOULDER SURGERY Left    TRACHEOSTOMY     Social History   Occupational History   Not on file  Tobacco Use    Smoking status: Former    Packs/day: 2    Types: Cigarettes    Quit date: 07/20/2020    Years since quitting: 1.8   Smokeless tobacco: Never  Vaping Use   Vaping Use: Some days  Substance and Sexual Activity   Alcohol use: Not Currently    Alcohol/week: 1.0 standard drink of alcohol    Types: 1 Cans of beer per week    Comment: 1 a week during football season   

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
## 2022-07-09 ENCOUNTER — Ambulatory Visit (HOSPITAL_COMMUNITY)
Admission: RE | Admit: 2022-07-09 | Discharge: 2022-07-09 | Disposition: A | Discharge status: Home / Self Care | Payer: No Typology Code available for payment source | Source: Ambulatory Visit | Attending: Vascular Surgery | Admitting: Vascular Surgery

## 2022-07-09 ENCOUNTER — Ambulatory Visit (INDEPENDENT_AMBULATORY_CARE_PROVIDER_SITE_OTHER): Payer: No Typology Code available for payment source | Admitting: Vascular Surgery

## 2022-07-09 ENCOUNTER — Encounter: Payer: Self-pay | Admitting: Vascular Surgery

## 2022-07-09 VITALS — BP 109/74 | HR 76 | Temp 97.9°F | Resp 20 | Ht 65.0 in | Wt 114.0 lb

## 2022-07-09 DIAGNOSIS — I739 Peripheral vascular disease, unspecified: Secondary | ICD-10-CM | POA: Diagnosis not present

## 2022-07-09 DIAGNOSIS — I7409 Other arterial embolism and thrombosis of abdominal aorta: Secondary | ICD-10-CM

## 2022-07-09 LAB — VAS US ABI WITH/WO TBI
Left ABI: 0.63
Right ABI: 0.58

## 2022-11-14 ENCOUNTER — Telehealth: Payer: Self-pay

## 2022-11-14 NOTE — Telephone Encounter (Signed)
Pt called stating that for the past two weeks, he has had increased swelling on his BL groins.  Reviewed pt's chart, returned call for clarification, two identifiers used. Pt stated that he'd just been in the hospital with sepsis and cellulitis. He was on a course of ABX. Instructed him to finish therapy and see if he still needed to be seen. He needs to finish ABX and f/u with PCP before moving his 11/19 appt up any earlier, if he's still having significant symptoms.

## 2023-01-07 ENCOUNTER — Ambulatory Visit: Payer: No Typology Code available for payment source | Admitting: Vascular Surgery
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
## 2023-01-14 ENCOUNTER — Encounter: Payer: Self-pay | Admitting: Vascular Surgery

## 2023-01-14 ENCOUNTER — Ambulatory Visit (INDEPENDENT_AMBULATORY_CARE_PROVIDER_SITE_OTHER): Payer: No Typology Code available for payment source | Admitting: Vascular Surgery

## 2023-01-14 VITALS — BP 109/74 | HR 74 | Temp 98.4°F | Resp 20 | Ht 65.0 in | Wt 113.5 lb

## 2023-01-14 DIAGNOSIS — Z95828 Presence of other vascular implants and grafts: Secondary | ICD-10-CM | POA: Diagnosis not present

## 2023-01-21 ENCOUNTER — Other Ambulatory Visit: Payer: Self-pay

## 2023-01-21 DIAGNOSIS — I739 Peripheral vascular disease, unspecified: Secondary | ICD-10-CM

## 2023-02-14 ENCOUNTER — Encounter: Payer: Self-pay | Admitting: Nurse Practitioner

## 2023-03-04 ENCOUNTER — Ambulatory Visit: Payer: No Typology Code available for payment source | Admitting: Nurse Practitioner

## 2023-03-04 ENCOUNTER — Other Ambulatory Visit: Payer: No Typology Code available for payment source

## 2023-03-04 ENCOUNTER — Encounter: Payer: Self-pay | Admitting: Nurse Practitioner

## 2023-03-04 VITALS — BP 124/80 | HR 83 | Ht 65.0 in | Wt 118.1 lb

## 2023-03-04 DIAGNOSIS — Z8719 Personal history of other diseases of the digestive system: Secondary | ICD-10-CM

## 2023-03-04 DIAGNOSIS — R195 Other fecal abnormalities: Secondary | ICD-10-CM | POA: Diagnosis not present

## 2023-03-04 DIAGNOSIS — D649 Anemia, unspecified: Secondary | ICD-10-CM | POA: Diagnosis not present

## 2023-03-04 LAB — COMPREHENSIVE METABOLIC PANEL
ALT: 16 U/L (ref 0–53)
AST: 22 U/L (ref 0–37)
Albumin: 4.5 g/dL (ref 3.5–5.2)
Alkaline Phosphatase: 89 U/L (ref 39–117)
BUN: 18 mg/dL (ref 6–23)
CO2: 26 meq/L (ref 19–32)
Calcium: 9.8 mg/dL (ref 8.4–10.5)
Chloride: 102 meq/L (ref 96–112)
Creatinine, Ser: 0.9 mg/dL (ref 0.40–1.50)
GFR: 85.8 mL/min (ref >60.00)
Glucose, Bld: 80 mg/dL (ref 70–99)
Potassium: 4 meq/L (ref 3.5–5.1)
Sodium: 138 meq/L (ref 135–145)
Total Bilirubin: 0.3 mg/dL (ref 0.2–1.2)
Total Protein: 7.9 g/dL (ref 6.0–8.3)

## 2023-03-04 LAB — IBC + FERRITIN
Ferritin: 106.7 ng/mL (ref 22.0–322.0)
Iron: 75 ug/dL (ref 42–165)
Saturation Ratios: 26.3 % (ref 20.0–50.0)
TIBC: 285.6 ug/dL (ref 250.0–450.0)
Transferrin: 204 mg/dL — ABNORMAL LOW (ref 212.0–360.0)

## 2023-03-04 LAB — CBC
HCT: 41.9 % (ref 39.0–52.0)
Hemoglobin: 13.8 g/dL (ref 13.0–17.0)
MCHC: 33 g/dL (ref 30.0–36.0)
MCV: 94.4 fL (ref 78.0–100.0)
Platelets: 178 10*3/uL (ref 150.0–400.0)
RBC: 4.44 Mil/uL (ref 4.22–5.81)
RDW: 15.3 % (ref 11.5–15.5)
WBC: 5.6 10*3/uL (ref 4.0–10.5)

## 2023-03-04 NOTE — Progress Notes (Signed)
 Agree with assessment and plan as outlined.

## 2023-03-04 NOTE — Patient Instructions (Addendum)
 You have been scheduled for a colonoscopy. Please follow written instructions given to you at your visit today.  ___________________________________________________________________________  Your provider has requested that you go to the basement level for lab work before leaving today. Press B on the elevator. The lab is located at the first door on the left as you exit the elevator.  Follow up with your primary care provider and vascular specialist to verify Aspirin  recommendations.  Due to recent changes in healthcare laws, you may see the results of your imaging and laboratory studies on MyChart before your provider has had a chance to review them.  We understand that in some cases there may be results that are confusing or concerning to you. Not all laboratory results come back in the same time frame and the provider may be waiting for multiple results in order to interpret others.  Please give us  48 hours in order for your provider to thoroughly review all the results before contacting the office for clarification of your results.   Thank you for trusting me with your gastrointestinal care!   Elida Shawl, CRNP
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
## 2023-03-28 ENCOUNTER — Encounter: Payer: Self-pay | Admitting: Gastroenterology

## 2023-03-28 ENCOUNTER — Ambulatory Visit (AMBULATORY_SURGERY_CENTER): Payer: No Typology Code available for payment source | Admitting: Gastroenterology

## 2023-03-28 VITALS — BP 107/80 | HR 73 | Temp 97.7°F | Resp 16 | Ht 65.0 in | Wt 118.0 lb

## 2023-03-28 DIAGNOSIS — D122 Benign neoplasm of ascending colon: Secondary | ICD-10-CM

## 2023-03-28 DIAGNOSIS — R195 Other fecal abnormalities: Secondary | ICD-10-CM | POA: Diagnosis not present

## 2023-03-28 DIAGNOSIS — K648 Other hemorrhoids: Secondary | ICD-10-CM | POA: Diagnosis not present

## 2023-03-28 DIAGNOSIS — D120 Benign neoplasm of cecum: Secondary | ICD-10-CM | POA: Diagnosis not present

## 2023-03-28 DIAGNOSIS — D123 Benign neoplasm of transverse colon: Secondary | ICD-10-CM | POA: Diagnosis not present

## 2023-03-28 DIAGNOSIS — Z1211 Encounter for screening for malignant neoplasm of colon: Secondary | ICD-10-CM | POA: Diagnosis present

## 2023-03-28 DIAGNOSIS — K6289 Other specified diseases of anus and rectum: Secondary | ICD-10-CM

## 2023-03-28 MED ORDER — SODIUM CHLORIDE 0.9 % IV SOLN: 500.0000 mL | Freq: Once | INTRAVENOUS | Status: DC

## 2023-03-28 NOTE — Progress Notes (Signed)
 History and Physical Interval Note: seen on Jan 14th - no interval changes. Positive FIT test, last colonoscopy 15-20 years ago. Otherwise feels well today without complaints. No bowel problems. NO FH of CRC. Denies any cardiopulmonary symptoms, he wishes to proceed.    03/28/2023 3:36 PM  Adrian Washington  has presented today for endoscopic procedure(s), with the diagnosis of  Encounter Diagnosis  Name Primary?   Positive FIT (fecal immunochemical test) Yes  .  The various methods of evaluation and treatment have been discussed with the patient and/or family. After consideration of risks, benefits and other options for treatment, the patient has consented to  the endoscopic procedure(s).   The patient's history has been reviewed, patient examined, no change in status, stable for surgery.  I have reviewed the patient's chart and labs.  Questions were answered to the patient's satisfaction.    Marcey Naval, MD [facility]

## 2023-03-28 NOTE — Progress Notes (Signed)
 Vss nad trans to pacu

## 2023-03-28 NOTE — Progress Notes (Signed)
 Called to room to assist during endoscopic procedure.  Patient ID and intended procedure confirmed with present staff. Received instructions for my participation in the procedure from the performing physician.

## 2023-03-28 NOTE — Progress Notes (Signed)
 Pt's states no medical or surgical changes since previsit or office visit.

## 2023-03-28 NOTE — Op Note (Signed)
 [facility] Patient Name: Adrian Washington Procedure Date: 03/28/2023 3:35 PM MRN: [id number] Endoscopist: Elspeth P. Leigh , MD, [id number] Age: 73 Referring MD:  Date of Birth: [birth year] Gender: Male Account #: [id number] Procedure:                Colonoscopy Indications:              Positive fecal immunochemical test - Ultraslim                            pediatric colonoscope used for this exam Medicines:                Monitored Anesthesia Care Procedure:                Pre-Anesthesia Assessment:                           - Prior to the procedure, a History and Physical                            was performed, and patient medications and                            allergies were reviewed. The patient's tolerance of                            previous anesthesia was also reviewed. The risks                            and benefits of the procedure and the sedation                            options and risks were discussed with the patient.                            All questions were answered, and informed consent                            was obtained. Prior Anticoagulants: The patient has                            taken no anticoagulant or antiplatelet agents. ASA                            Grade Assessment: III - A patient with severe                            systemic disease. After reviewing the risks and                            benefits, the patient was deemed in satisfactory                            condition to undergo the procedure.  After obtaining informed consent, the colonoscope                            was passed under direct vision. Throughout the                            procedure, the patient's blood pressure, pulse, and                            oxygen saturations were monitored continuously. The                            PCF-H190TL Slim SN [id number] was introduced through                            the anus and  advanced to the the terminal ileum,                            with identification of the appendiceal orifice and                            IC valve. The colonoscopy was performed without                            difficulty. The patient tolerated the procedure                            well. The quality of the bowel preparation was                            good. The terminal ileum, ileocecal valve,                            appendiceal orifice, and rectum were photographed. Scope In: 3:44:01 PM Scope Out: 4:06:04 PM Scope Withdrawal Time: 0 hours 18 minutes 1 second  Total Procedure Duration: 0 hours 22 minutes 3 seconds  Findings:                 The perianal and digital rectal examinations were                            normal.                           The terminal ileum appeared normal.                           A diminutive polyp was found in the cecum. The                            polyp was sessile. The polyp was removed with a                            cold snare. Resection and retrieval were complete.  A 4 mm polyp was found in the ileocecal valve. The                            polyp was sessile. The polyp was removed with a                            cold snare. Resection and retrieval were complete.                           Two sessile polyps were found in the ascending                            colon. The polyps were 2 to 4 mm in size. These                            polyps were removed with a cold snare. Resection                            and retrieval were complete.                           A 6 to 7 mm polyp was found in the transverse                            colon. The polyp was sessile. The polyp was removed                            with a cold snare. Resection and retrieval were                            complete.                           Internal hemorrhoids were found during                            retroflexion. The  hemorrhoids were small.                           Anal papilla(e) were hypertrophied.                           The exam was otherwise without abnormality. Complications:            No immediate complications. Estimated blood loss:                            Minimal. Estimated Blood Loss:     Estimated blood loss was minimal. Impression:               - The examined portion of the ileum was normal.                           - One diminutive polyp in the cecum, removed with a  cold snare. Resected and retrieved.                           - One 4 mm polyp at the ileocecal valve, removed                            with a cold snare. Resected and retrieved.                           - Two 2 to 4 mm polyps in the ascending colon,                            removed with a cold snare. Resected and retrieved.                           - One 6 to 7 mm polyp in the transverse colon,                            removed with a cold snare. Resected and retrieved.                           - Internal hemorrhoids.                           - Anal papilla(e) were hypertrophied.                           - The examination was otherwise normal. Recommendation:           - Patient has a contact number available for                            emergencies. The signs and symptoms of potential                            delayed complications were discussed with the                            patient. Return to normal activities tomorrow.                            Written discharge instructions were provided to the                            patient.                           - Resume previous diet.                           - Continue present medications.                           - Await pathology results. Elspeth P. Shaun Zuccaro, MD 03/28/2023 4:14:23 PM This report has been signed electronically.

## 2023-03-28 NOTE — Patient Instructions (Signed)

## 2023-03-31 ENCOUNTER — Telehealth: Payer: Self-pay

## 2023-03-31 NOTE — Telephone Encounter (Signed)
 Post procedure follow up call, no answer

## 2023-04-02 LAB — SURGICAL PATHOLOGY

## 2023-04-05 ENCOUNTER — Encounter: Payer: Self-pay | Admitting: Gastroenterology

## 2023-06-10 ENCOUNTER — Other Ambulatory Visit (HOSPITAL_COMMUNITY): Payer: Self-pay | Admitting: Physician Assistant

## 2023-06-10 DIAGNOSIS — Z87891 Personal history of nicotine dependence: Secondary | ICD-10-CM

## 2023-06-16 ENCOUNTER — Ambulatory Visit (HOSPITAL_COMMUNITY)

## 2023-06-16 ENCOUNTER — Encounter (HOSPITAL_COMMUNITY): Payer: Self-pay
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
## 2023-07-15 ENCOUNTER — Ambulatory Visit: Payer: No Typology Code available for payment source | Attending: Vascular Surgery | Admitting: Vascular Surgery

## 2023-07-15 ENCOUNTER — Ambulatory Visit (HOSPITAL_COMMUNITY): Payer: No Typology Code available for payment source | Attending: Vascular Surgery

## 2023-07-31 ENCOUNTER — Ambulatory Visit (HOSPITAL_COMMUNITY)

## 2023-08-05 ENCOUNTER — Ambulatory Visit (HOSPITAL_COMMUNITY)
Admission: RE | Admit: 2023-08-05 | Discharge: 2023-08-05 | Disposition: A | Discharge status: Home / Self Care | Source: Ambulatory Visit | Attending: Physician Assistant | Admitting: Physician Assistant

## 2023-08-05 DIAGNOSIS — Z87891 Personal history of nicotine dependence: Secondary | ICD-10-CM | POA: Insufficient documentation
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
## 2023-09-02 ENCOUNTER — Ambulatory Visit (INDEPENDENT_AMBULATORY_CARE_PROVIDER_SITE_OTHER): Admitting: Vascular Surgery

## 2023-09-02 ENCOUNTER — Ambulatory Visit (HOSPITAL_COMMUNITY)
Admission: RE | Admit: 2023-09-02 | Discharge: 2023-09-02 | Disposition: A | Discharge status: Home / Self Care | Source: Ambulatory Visit | Attending: Vascular Surgery | Admitting: Vascular Surgery

## 2023-09-02 ENCOUNTER — Encounter: Payer: Self-pay | Admitting: Vascular Surgery

## 2023-09-02 VITALS — BP 123/83 | HR 66 | Temp 97.5°F | Ht 65.0 in | Wt 115.0 lb

## 2023-09-02 DIAGNOSIS — I739 Peripheral vascular disease, unspecified: Secondary | ICD-10-CM

## 2023-09-02 LAB — VAS US ABI WITH/WO TBI
Left ABI: 0.56
Right ABI: 0.54

## 2023-09-03 ENCOUNTER — Other Ambulatory Visit: Payer: Self-pay

## 2023-09-03 DIAGNOSIS — I739 Peripheral vascular disease, unspecified: Secondary | ICD-10-CM

## 2024-03-02 ENCOUNTER — Encounter (HOSPITAL_COMMUNITY)

## 2024-03-02 ENCOUNTER — Ambulatory Visit: Admitting: Vascular Surgery

## 2024-04-13 ENCOUNTER — Ambulatory Visit (HOSPITAL_COMMUNITY)

## 2024-04-13 ENCOUNTER — Ambulatory Visit: Admitting: Vascular Surgery
